# Patient Record
Sex: Male | Born: 1950 | Race: Black or African American | Hispanic: No | Marital: Single | Smoking: Never smoker
Health system: Southern US, Community
[De-identification: ages and names within clinical notes are randomized; demographics above are authoritative.]

## PROBLEM LIST (undated history)

## (undated) DIAGNOSIS — I509 Heart failure, unspecified: Secondary | ICD-10-CM

## (undated) DIAGNOSIS — I4891 Unspecified atrial fibrillation: Secondary | ICD-10-CM

## (undated) DIAGNOSIS — E669 Obesity, unspecified: Secondary | ICD-10-CM

## (undated) DIAGNOSIS — I10 Essential (primary) hypertension: Secondary | ICD-10-CM

## (undated) HISTORY — DX: Essential (primary) hypertension: I10

---

## 2010-09-05 ENCOUNTER — Emergency Department (HOSPITAL_COMMUNITY)

## 2010-09-05 ENCOUNTER — Emergency Department (HOSPITAL_COMMUNITY)
Admission: EM | Admit: 2010-09-05 | Discharge: 2010-09-05 | Disposition: A | Discharge status: Home / Self Care | Attending: Emergency Medicine | Admitting: Emergency Medicine

## 2010-09-05 DIAGNOSIS — R0602 Shortness of breath: Secondary | ICD-10-CM | POA: Insufficient documentation

## 2010-09-05 DIAGNOSIS — I10 Essential (primary) hypertension: Secondary | ICD-10-CM | POA: Insufficient documentation

## 2010-09-05 DIAGNOSIS — I509 Heart failure, unspecified: Secondary | ICD-10-CM | POA: Insufficient documentation

## 2010-09-05 DIAGNOSIS — R609 Edema, unspecified: Secondary | ICD-10-CM | POA: Insufficient documentation

## 2010-09-05 LAB — POCT I-STAT, CHEM 8
Calcium, Ion: 1.15 mmol/L (ref 1.12–1.32)
Glucose, Bld: 95 mg/dL (ref 70–99)
HCT: 36 % — ABNORMAL LOW (ref 39.0–52.0)
Hemoglobin: 12.2 g/dL — ABNORMAL LOW (ref 13.0–17.0)
Potassium: 4 mEq/L (ref 3.5–5.1)

## 2010-09-05 LAB — DIFFERENTIAL
Basophils Absolute: 0.1 10*3/uL (ref 0.0–0.1)
Basophils Relative: 1 % (ref 0–1)
Eosinophils Absolute: 0.5 10*3/uL (ref 0.0–0.7)
Eosinophils Relative: 6 % — ABNORMAL HIGH (ref 0–5)
Lymphocytes Relative: 18 % (ref 12–46)

## 2010-09-05 LAB — CBC
Platelets: 254 10*3/uL (ref 150–400)
RDW: 15.6 % — ABNORMAL HIGH (ref 11.5–15.5)
WBC: 7.8 10*3/uL (ref 4.0–10.5)

## 2011-09-22 ENCOUNTER — Ambulatory Visit (INDEPENDENT_AMBULATORY_CARE_PROVIDER_SITE_OTHER): Admitting: Family Medicine

## 2011-09-22 VITALS — BP 165/99 | HR 76 | Temp 98.3°F | Resp 18 | Ht 71.0 in | Wt 270.8 lb

## 2011-09-22 DIAGNOSIS — I10 Essential (primary) hypertension: Secondary | ICD-10-CM

## 2011-09-22 DIAGNOSIS — R21 Rash and other nonspecific skin eruption: Secondary | ICD-10-CM

## 2011-09-22 MED ORDER — LABETALOL HCL 100 MG PO TABS: ORAL_TABLET | ORAL | Status: DC

## 2011-09-22 MED ORDER — AMLODIPINE BESYLATE 10 MG PO TABS: 10.0000 mg | ORAL_TABLET | Freq: Every day | ORAL | Status: DC

## 2011-09-22 NOTE — Progress Notes (Signed)
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 He's had high blood pressure for 20 years. In the past she's taken labetalol 200 mg twice a day and functioned very nicely. About 5 months ago he was seen by another doctor who put him on 300 mg of labetalol 3 times a day and has been sleeping and unable to work ever since. In addition he complains about a rash on his left forearm which he said began after he started increasing his nifedipine  Objective: Obese African American man in no acute distress. Blood pressure week check 150/88  HEENT: Unremarkable  Chest: Clear  Heart: Regular, 1/6 systolic ejection murmur  Abdomen: Soft nontender without HSM  Extremities: No edema  Skin: Confluent macular, annular rash in left antecubital area with mild hyperpigmentation  Assessment: Overmedicated with respect to high blood pressure, macular rash secondary to nifedipine

## 2012-06-11 ENCOUNTER — Other Ambulatory Visit: Payer: Self-pay | Admitting: Family Medicine

## 2012-09-08 ENCOUNTER — Ambulatory Visit (INDEPENDENT_AMBULATORY_CARE_PROVIDER_SITE_OTHER): Admitting: Family Medicine

## 2012-09-08 VITALS — BP 155/95 | HR 90 | Temp 97.7°F | Resp 16 | Ht 70.5 in | Wt 271.0 lb

## 2012-09-08 DIAGNOSIS — I10 Essential (primary) hypertension: Secondary | ICD-10-CM | POA: Insufficient documentation

## 2012-09-08 LAB — POCT CBC
HCT, POC: 40.2 % — AB (ref 43.5–53.7)
Lymph, poc: 2 (ref 0.6–3.4)
MCHC: 31.3 g/dL — AB (ref 31.8–35.4)
MCV: 89.3 fL (ref 80–97)
POC LYMPH PERCENT: 19.9 %L (ref 10–50)
RDW, POC: 16.7 %
WBC: 9.8 10*3/uL (ref 4.6–10.2)

## 2012-09-08 LAB — COMPREHENSIVE METABOLIC PANEL
Albumin: 4.1 g/dL (ref 3.5–5.2)
Alkaline Phosphatase: 53 U/L (ref 39–117)
BUN: 15 mg/dL (ref 6–23)
CO2: 27 mEq/L (ref 19–32)
Glucose, Bld: 104 mg/dL — ABNORMAL HIGH (ref 70–99)
Potassium: 4.2 mEq/L (ref 3.5–5.3)
Total Bilirubin: 0.8 mg/dL (ref 0.3–1.2)

## 2012-09-08 MED ORDER — LABETALOL HCL 100 MG PO TABS: 100.0000 mg | ORAL_TABLET | Freq: Three times a day (TID) | ORAL | Status: DC

## 2012-09-08 MED ORDER — AMLODIPINE BESYLATE 10 MG PO TABS: 10.0000 mg | ORAL_TABLET | Freq: Every day | ORAL | Status: DC

## 2012-09-08 NOTE — Patient Instructions (Addendum)
Please continue to take your medications and check your blood pressure every couple of days.  Keep a record of your BP readings.  Please contact me with your BP readings in a couple of weeks.

## 2012-09-08 NOTE — Progress Notes (Addendum)
[facility] [address], [place] [address] [phone]  Date:  09/08/2012   Name:  Tanner Gilbert   DOB:  [birth year]   MRN:  [id number]  PCP:  Provider Not In System    Chief Complaint: Medication Refill   History of Present Illness:  Tanner Gilbert is a 62 y.o. very pleasant male patient who presents with the following:  He is here today to get a refill of his BP medications.  He takes labetolol 100 TID and norvasc 10 once a day. He took a labetolol just now.  Last year Dr. Milus Glazier decreased his labetolol dosage and stopped his nifedipine due to sedation and rash (thought due to nifedipine).   He does check his BP sometimes- but is not sure what sort of reading he might get.  He does not record this.   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 However, he does not want to D/C this medication.  "Every time I take my labetolol I need to take a nap."  He does not wish to D/C this medication, but might like to decrease the dose   There are no active problems to display for this patient.   No past medical history on file.  No past surgical history on file.  History  Substance Use Topics  . Smoking status: Never Smoker   . Smokeless tobacco: Not on file  . Alcohol Use: No    No family history on file.  No Known Allergies  Medication list has been reviewed and updated.  Current Outpatient Prescriptions on File Prior to Visit  Medication Sig Dispense Refill  . amLODipine (NORVASC) 10 MG tablet Take 1 tablet (10 mg total) by mouth daily.  90 tablet  3  . aspirin 81 MG tablet Take 81 mg by mouth daily.      Marland Kitchen labetalol (NORMODYNE) 100 MG tablet Take 1 tablet (100 mg total) by mouth 3 (three) times daily. Needs office visit  270 tablet  0   No current facility-administered medications on file prior to visit.    Review of  Systems:  As per HPI- otherwise negative.   Physical Examination: Filed Vitals:   09/08/12 1253  BP: 167/107  Pulse: 102  Temp: 97.7 F (36.5 C)  Resp: 16   Filed Vitals:   09/08/12 1253  Height: 5' 10.5" (1.791 m)  Weight: 271 lb (122.925 kg)   Body mass index is 38.32 kg/(m^2). Ideal Body Weight: Weight in (lb) to have BMI = 25: 176.4  GEN: WDWN, NAD, Non-toxic, A & O x 3, obese HEENT: Atraumatic, Normocephalic. Neck supple. No masses, No LAD. Ears and Nose: No external deformity. CV: RRR, No M/G/R. No JVD. No thrill. No extra heart sounds. PULM: CTA B, no wheezes, crackles, rhonchi. No retractions. No resp. distress. No accessory muscle use. EXTR: No c/c/e NEURO Normal gait.  PSYCH: Normally interactive. Conversant. Not depressed or anxious appearing.  Calm demeanor.    Assessment and Plan: Hypertension - Plan: amLODipine (NORVASC) 10 MG tablet, labetalol (NORMODYNE) 100 MG tablet, POCT CBC, Comprehensive metabolic panel  BP medication refill. He would like to decrease his labetolol dosage due to SE, but his BP is not well controlled per today's reading.  We do not have much data regarding his BP- asked him to please check his BP with his home  cuff every couple of days and to get back in touch with me with these results in about 2 weeks.  Await CMP- will follow- up with him.  Also plan to schedule a sleep study.    Signed Abbe Amsterdam, MD  Results for orders placed in visit on 09/08/12  COMPREHENSIVE METABOLIC PANEL      Result Value Range   Sodium 136  135 - 145 mEq/L   Potassium 4.2  3.5 - 5.3 mEq/L   Chloride 101  96 - 112 mEq/L   CO2 27  19 - 32 mEq/L   Glucose, Bld 104 (*) 70 - 99 mg/dL   BUN 15  6 - 23 mg/dL   Creat 1.61  0.96 - 0.45 mg/dL   Total Bilirubin 0.8  0.3 - 1.2 mg/dL   Alkaline Phosphatase 53  39 - 117 U/L   AST 22  0 - 37 U/L   ALT 26  0 - 53 U/L   Total Protein 8.1  6.0 - 8.3 g/dL   Albumin 4.1  3.5 - 5.2 g/dL   Calcium 9.4  8.4 - 40.9  mg/dL  POCT CBC      Result Value Range   WBC 9.8  4.6 - 10.2 K/uL   Lymph, poc 2.0  0.6 - 3.4   POC LYMPH PERCENT 19.9  10 - 50 %L   MID (cbc) 0.6  0 - 0.9   POC MID % 6.5  0 - 12 %M   POC Granulocyte 7.2 (*) 2 - 6.9   Granulocyte percent 73.6  37 - 80 %G   RBC 4.50 (*) 4.69 - 6.13 M/uL   Hemoglobin 12.6 (*) 14.1 - 18.1 g/dL   HCT, POC 81.1 (*) 91.4 - 53.7 %   MCV 89.3  80 - 97 fL   MCH, POC 28.0  27 - 31.2 pg   MCHC 31.3 (*) 31.8 - 35.4 g/dL   RDW, POC 78.2     Platelet Count, POC 252  142 - 424 K/uL   MPV 8.9  0 - 99.8 fL   Called regarding his labs- however, his number did not work, and the other number listed (his son) is a Materials engineer.   He needs to be alerted regarding his anemia (needs a colonoscopy) and I would also like to arrange a sleep study.   Will send him a letter

## 2012-09-09 ENCOUNTER — Encounter: Payer: Self-pay | Admitting: Family Medicine

## 2012-09-11 ENCOUNTER — Other Ambulatory Visit: Payer: Self-pay | Admitting: Family Medicine

## 2012-10-06 ENCOUNTER — Other Ambulatory Visit: Payer: Self-pay | Admitting: Family Medicine

## 2012-10-06 NOTE — Progress Notes (Signed)
Pt sent me a copy of several BP readings. Some examples 111/72, 129/ 81, 132/80, 138/83.   Will send a letter asking him to come in and be seen

## 2013-08-25 ENCOUNTER — Other Ambulatory Visit: Payer: Self-pay | Admitting: Family Medicine

## 2013-09-09 ENCOUNTER — Ambulatory Visit (INDEPENDENT_AMBULATORY_CARE_PROVIDER_SITE_OTHER): Admitting: Family Medicine

## 2013-09-09 VITALS — BP 126/82 | HR 83 | Temp 98.4°F | Resp 18 | Ht 71.0 in | Wt 263.0 lb

## 2013-09-09 DIAGNOSIS — I10 Essential (primary) hypertension: Secondary | ICD-10-CM

## 2013-09-09 DIAGNOSIS — Z13220 Encounter for screening for lipoid disorders: Secondary | ICD-10-CM

## 2013-09-09 DIAGNOSIS — Z125 Encounter for screening for malignant neoplasm of prostate: Secondary | ICD-10-CM

## 2013-09-09 LAB — CBC
HEMATOCRIT: 36.5 % — AB (ref 39.0–52.0)
Hemoglobin: 13.1 g/dL (ref 13.0–17.0)
MCH: 29 pg (ref 26.0–34.0)
MCHC: 35.9 g/dL (ref 30.0–36.0)
MCV: 80.8 fL (ref 78.0–100.0)
PLATELETS: 252 10*3/uL (ref 150–400)
RBC: 4.52 MIL/uL (ref 4.22–5.81)
RDW: 15.2 % (ref 11.5–15.5)
WBC: 8.8 10*3/uL (ref 4.0–10.5)

## 2013-09-09 MED ORDER — AMLODIPINE BESYLATE 5 MG PO TABS: 5.0000 mg | ORAL_TABLET | Freq: Every day | ORAL | Status: DC

## 2013-09-09 MED ORDER — LABETALOL HCL 100 MG PO TABS: 100.0000 mg | ORAL_TABLET | Freq: Three times a day (TID) | ORAL | Status: DC

## 2013-09-09 NOTE — Patient Instructions (Signed)
Good to see you today- your BP looks fine. I will be in touch with your labs.    If you like, get the shingles vaccine using your paper rx.  This is available at most major drug stores and is a one- time shot.    Decrease your amlodipine to 5mg  a day. However if you continue to run low (less than about 120/70) we can probably stop this medication entirely.  Just let me know if you continue to run low

## 2013-09-09 NOTE — Progress Notes (Signed)
[facility] [address], [place] [address] [phone]  Date:  09/09/2013   Name:  Tanner Gilbert   DOB:  01-27-1951   MRN:  [id number]  PCP:  PROVIDER NOT IN SYSTEM    Chief Complaint: Follow-up   History of Present Illness:  Tanner Gilbert is a 63 y.o. very pleasant male patient who presents with the following:  Here today to follow-up HTN.  He does check his BP at home and may get 120/76, max about 127/80.  He is feeling well.   He did eat breakfast and took his meds this am.  He does notice that after he takes his am amlodipine and labetolol he will sometimes feel like his BP gets low and he has to lie down  He did have a colon[place], [place].  His last was about 8 years ago Also had a tetanus 4 or 5 years ago.    Patient Active Problem List   Diagnosis Date Noted  . HTN (hypertension) with goal to be determined 09/08/2012  . Morbid obesity 09/08/2012    Past Medical History  Diagnosis Date  . Hypertension     History reviewed. No pertinent past surgical history.  History  Substance Use Topics  . Smoking status: Never Smoker   . Smokeless tobacco: Not on file  . Alcohol Use: No    History reviewed. No pertinent family history.  No Known Allergies  Medication list has been reviewed and updated.  Current Outpatient Prescriptions on File Prior to Visit  Medication Sig Dispense Refill  . amLODipine (NORVASC) 10 MG tablet Take 1 tablet (10 mg total) by mouth daily. PATIENT NEEDS OFFICE VISIT FOR ADDITIONAL REFILLS  30 tablet  0  . aspirin 81 MG tablet Take 81 mg by mouth daily.      Marland Kitchen. labetalol (NORMODYNE) 100 MG tablet Take 1 tablet (100 mg total) by mouth 3 (three) times daily.  270 tablet  2   No current facility-administered medications on file prior to visit.    Review of Systems:  As per HPI- otherwise negative.   Physical Examination: Filed Vitals:   09/09/13 1124  BP: 126/82  Pulse: 83   Temp: 98.4 F (36.9 C)  Resp: 18   Filed Vitals:   09/09/13 1124  Height: 5\' 11"  (1.803 m)  Weight: 263 lb (119.296 kg)   Body mass index is 36.7 kg/(m^2). Ideal Body Weight: Weight in (lb) to have BMI = 25: 178.9  GEN: WDWN, NAD, Non-toxic, A & O x 3, obese , looks well HEENT: Atraumatic, Normocephalic. Neck supple. No masses, No LAD. Ears and Nose: No external deformity. CV: RRR, No M/G/R. No JVD. No thrill. No extra heart sounds. PULM: CTA B, no wheezes, crackles, rhonchi. No retractions. No resp. distress. No accessory muscle use. ABD: S, NT, ND. No rebound. No HSM. EXTR: No c/c/e NEURO Normal gait.  PSYCH: Normally interactive. Conversant. Not depressed or anxious appearing.  Calm demeanor.    Assessment and Plan: Hypertension - Plan: amLODipine (NORVASC) 5 MG tablet, labetalol (NORMODYNE) 100 MG tablet, CBC, Comprehensive metabolic panel  Screening for hyperlipidemia - Plan: Lipid panel  Screening for prostate cancer - Plan: PSA  BP is controlled.  He reports some sx of hypotension- will have him decrease his am amlodipine to 5mg  and he will continue to follow Given rx for zostavax  Will plan further follow- up pending labs.   Signed Abbe AmsterdamJessica Copland, MD

## 2013-09-10 LAB — COMPREHENSIVE METABOLIC PANEL
ALK PHOS: 48 U/L (ref 39–117)
ALT: 13 U/L (ref 0–53)
AST: 20 U/L (ref 0–37)
Albumin: 4.4 g/dL (ref 3.5–5.2)
BUN: 17 mg/dL (ref 6–23)
CO2: 26 mEq/L (ref 19–32)
CREATININE: 1.31 mg/dL (ref 0.50–1.35)
Calcium: 9.4 mg/dL (ref 8.4–10.5)
Chloride: 103 mEq/L (ref 96–112)
Glucose, Bld: 91 mg/dL (ref 70–99)
POTASSIUM: 4 meq/L (ref 3.5–5.3)
Sodium: 138 mEq/L (ref 135–145)
Total Bilirubin: 0.7 mg/dL (ref 0.2–1.2)
Total Protein: 8.1 g/dL (ref 6.0–8.3)

## 2013-09-10 LAB — LIPID PANEL
CHOL/HDL RATIO: 3.8 ratio
Cholesterol: 199 mg/dL (ref 0–200)
HDL: 52 mg/dL (ref >39)
LDL CALC: 123 mg/dL — AB (ref 0–99)
Triglycerides: 118 mg/dL (ref <150)
VLDL: 24 mg/dL (ref 0–40)

## 2013-09-10 LAB — PSA: PSA: 7.02 ng/mL — AB (ref <=4.00)

## 2013-09-13 ENCOUNTER — Encounter: Payer: Self-pay | Admitting: Family Medicine

## 2013-09-15 ENCOUNTER — Telehealth: Payer: Self-pay | Admitting: Family Medicine

## 2013-09-15 NOTE — Telephone Encounter (Signed)
Have been calling for several days in an attempt to reach him.  Listed number has no answer and no machine. Called emergency contact but this seems to be a wrong answer

## 2014-02-07 ENCOUNTER — Other Ambulatory Visit: Payer: Self-pay

## 2014-02-07 MED ORDER — LABETALOL HCL 100 MG PO TABS: 100.0000 mg | ORAL_TABLET | Freq: Three times a day (TID) | ORAL | Status: DC

## 2014-02-07 MED ORDER — AMLODIPINE BESYLATE 5 MG PO TABS: 5.0000 mg | ORAL_TABLET | Freq: Every day | ORAL | Status: DC

## 2014-08-21 ENCOUNTER — Ambulatory Visit (INDEPENDENT_AMBULATORY_CARE_PROVIDER_SITE_OTHER): Admitting: Family Medicine

## 2014-08-21 VITALS — BP 148/90 | HR 109 | Temp 98.2°F | Resp 18 | Ht 71.0 in | Wt 294.0 lb

## 2014-08-21 DIAGNOSIS — I10 Essential (primary) hypertension: Secondary | ICD-10-CM

## 2014-08-21 DIAGNOSIS — I4891 Unspecified atrial fibrillation: Secondary | ICD-10-CM | POA: Diagnosis not present

## 2014-08-21 DIAGNOSIS — Z5181 Encounter for therapeutic drug level monitoring: Secondary | ICD-10-CM | POA: Diagnosis not present

## 2014-08-21 DIAGNOSIS — E669 Obesity, unspecified: Secondary | ICD-10-CM

## 2014-08-21 DIAGNOSIS — Z23 Encounter for immunization: Secondary | ICD-10-CM

## 2014-08-21 DIAGNOSIS — I499 Cardiac arrhythmia, unspecified: Secondary | ICD-10-CM | POA: Diagnosis not present

## 2014-08-21 LAB — LIPID PANEL
Cholesterol: 184 mg/dL (ref 0–200)
HDL: 52 mg/dL (ref >=40)
LDL Cholesterol: 113 mg/dL — ABNORMAL HIGH (ref 0–99)
TRIGLYCERIDES: 95 mg/dL (ref <150)
Total CHOL/HDL Ratio: 3.5 Ratio
VLDL: 19 mg/dL (ref 0–40)

## 2014-08-21 LAB — COMPREHENSIVE METABOLIC PANEL
ALBUMIN: 4.1 g/dL (ref 3.5–5.2)
ALT: 15 U/L (ref 0–53)
AST: 21 U/L (ref 0–37)
Alkaline Phosphatase: 42 U/L (ref 39–117)
BUN: 16 mg/dL (ref 6–23)
CALCIUM: 9.4 mg/dL (ref 8.4–10.5)
CHLORIDE: 102 meq/L (ref 96–112)
CO2: 26 meq/L (ref 19–32)
Creat: 1.29 mg/dL (ref 0.50–1.35)
GLUCOSE: 104 mg/dL — AB (ref 70–99)
Potassium: 4.2 mEq/L (ref 3.5–5.3)
SODIUM: 137 meq/L (ref 135–145)
TOTAL PROTEIN: 7.7 g/dL (ref 6.0–8.3)
Total Bilirubin: 1 mg/dL (ref 0.2–1.2)

## 2014-08-21 LAB — TSH: TSH: 2.561 u[IU]/mL (ref 0.350–4.500)

## 2014-08-21 LAB — HEMOGLOBIN A1C
HEMOGLOBIN A1C: 5.8 % — AB (ref <5.7)
Mean Plasma Glucose: 120 mg/dL — ABNORMAL HIGH (ref <117)

## 2014-08-21 MED ORDER — APIXABAN 5 MG PO TABS: 5.0000 mg | ORAL_TABLET | Freq: Two times a day (BID) | ORAL | Status: DC

## 2014-08-21 MED ORDER — LABETALOL HCL 100 MG PO TABS: 100.0000 mg | ORAL_TABLET | Freq: Three times a day (TID) | ORAL | Status: DC

## 2014-08-21 MED ORDER — AMLODIPINE BESYLATE 5 MG PO TABS: 5.0000 mg | ORAL_TABLET | Freq: Every day | ORAL | Status: DC

## 2014-08-21 NOTE — Progress Notes (Addendum)
[facility] [address], [place] [address] [phone]  Date:  08/21/2014   Name:  Tanner Gilbert   DOB:  Feb 23, 1951   MRN:  [id number]  PCP:  PROVIDER NOT IN SYSTEM    Chief Complaint: Medication Refill and Follow-up   History of Present Illness:  Tanner Gilbert is a 64 y.o. very pleasant male patient who presents with the following:  Here today to follow-up HTN.  Last seen by myself about one year ago.  He needs refills today.  He has not been checking his BP a lot recently and has gained some weight- "I got a little lazy."   He is fasting today for labs  He has stopped exercising and would like to start back  He denies any history of A fib or other cardiac problems, never had any cardiac procedure or surgery.  No CP, no SOB, no palpations.  He does not have a history of DM  Wt Readings from Last 3 Encounters:  08/21/14 294 lb (133.358 kg)  09/09/13 263 lb (119.296 kg)  09/08/12 271 lb (122.925 kg)     BP Readings from Last 3 Encounters:  08/21/14 148/90  09/09/13 126/82  09/08/12 155/95     Patient Active Problem List   Diagnosis Date Noted  . HTN (hypertension) with goal to be determined 09/08/2012  . Morbid obesity 09/08/2012    Past Medical History  Diagnosis Date  . Hypertension     History reviewed. No pertinent past surgical history.  History  Substance Use Topics  . Smoking status: Never Smoker   . Smokeless tobacco: Not on file  . Alcohol Use: No    History reviewed. No pertinent family history.  Allergies  Allergen Reactions  . Lisinopril     Swelling- angioedema    Medication list has been reviewed and updated.  Current Outpatient Prescriptions on File Prior to Visit  Medication Sig Dispense Refill  . amLODipine (NORVASC) 5 MG tablet Take 1 tablet (5 mg total) by mouth daily. 90 tablet 1  . aspirin 81 MG tablet Take 81 mg by mouth daily.    Marland Kitchen. labetalol (NORMODYNE) 100 MG tablet Take 1 tablet (100 mg total)  by mouth 3 (three) times daily. 270 tablet 2   No current facility-administered medications on file prior to visit.    Review of Systems:  As per HPI- otherwise negative.   Physical Examination: Filed Vitals:   08/21/14 1316  BP: 148/90  Pulse: 109  Temp: 98.2 F (36.8 C)  Resp: 18   Filed Vitals:   08/21/14 1316  Height: 5\' 11"  (1.803 m)  Weight: 294 lb (133.358 kg)   Body mass index is 41.02 kg/(m^2). Ideal Body Weight: Weight in (lb) to have BMI = 25: 178.9  GEN: WDWN, NAD, Non-toxic, A & O x 3, obese, looks well HEENT: Atraumatic, Normocephalic. Neck supple. No masses, No LAD. Ears and Nose: No external deformity. CV: No M/G/R. No JVD. No thrill. No extra heart sounds.  Noted irregular rhythm.  EKG shows a fib PULM: CTA B, no wheezes, crackles, rhonchi. No retractions. No resp. distress. No accessory muscle use. ABD: S, NT, ND, +BS. No rebound. No HSM. EXTR: No c/c/trace edema vs normal wide ankles.   NEURO Normal gait.  PSYCH: Normally interactive. Conversant. Not depressed or anxious appearing.  Calm demeanor.   EKG: atrial fibrillation- [facility] Dr. Eldridge DaceVaranasi Assessment and Plan: Atrial fibrillation, unspecified - Plan:  TSH, apixaban (ELIQUIS) 5 MG TABS tablet, Ambulatory referral to Cardiology, Brain natriuretic peptide  Essential hypertension - Plan: amLODipine (NORVASC) 5 MG tablet, labetalol (NORMODYNE) 100 MG tablet  Obesity - Plan: Lipid panel, Hemoglobin A1c  Medication monitoring encounter - Plan: Comprehensive metabolic panel  Irregular heart rhythm - Plan: EKG 12-Lead  Immunization due - Plan: Tdap vaccine greater than or equal to 7yo IM  Medication refills and labs today. tdap vaccine.  Also noted to be in a fib.  Discussed in detail with pt and his son, answered all questions . CHADS2 level of 2, will start on eliquis 5 BID.  Referral to cardiology soon.  Discussed risks and benefits of eliquis vs untreated a  fib (son had several questions which I answered to their satisfaction).   Await labs, do not suspect CHF but will check BNP for him with labs   Signed Abbe Amsterdam, MD  Called 5/24:  Results for orders placed or performed in visit on 08/21/14  Comprehensive metabolic panel  Result Value Ref Range   Sodium 137 135 - 145 mEq/L   Potassium 4.2 3.5 - 5.3 mEq/L   Chloride 102 96 - 112 mEq/L   CO2 26 19 - 32 mEq/L   Glucose, Bld 104 (H) 70 - 99 mg/dL   BUN 16 6 - 23 mg/dL   Creat 0.98 1.19 - 1.47 mg/dL   Total Bilirubin 1.0 0.2 - 1.2 mg/dL   Alkaline Phosphatase 42 39 - 117 U/L   AST 21 0 - 37 U/L   ALT 15 0 - 53 U/L   Total Protein 7.7 6.0 - 8.3 g/dL   Albumin 4.1 3.5 - 5.2 g/dL   Calcium 9.4 8.4 - 82.9 mg/dL  Lipid panel  Result Value Ref Range   Cholesterol 184 0 - 200 mg/dL   Triglycerides 95 <562 mg/dL   HDL 52 >=13 mg/dL   Total CHOL/HDL Ratio 3.5 Ratio   VLDL 19 0 - 40 mg/dL   LDL Cholesterol 086 (H) 0 - 99 mg/dL  Hemoglobin V7Q  Result Value Ref Range   Hgb A1c MFr Bld 5.8 (H) <5.7 %   Mean Plasma Glucose 120 (H) <117 mg/dL  TSH  Result Value Ref Range   TSH 2.561 0.350 - 4.500 uIU/mL  Brain natriuretic peptide  Result Value Ref Range   Brain Natriuretic Peptide 336.8 (H) 0.0 - 100.0 pg/mL   Called number listed for pt- got a message that the person at the number is not taking calls. Called his son's number- no answer, not able to LM

## 2014-08-21 NOTE — Patient Instructions (Addendum)
You have atrial fibrillation.  This means that your heart beat is not following the normal pattern.   The main danger with this condition is a blood clot- therefore, we are going to start you on a blood thinner called eliquis Take this twice a day and STOP the baby aspirin. Otherwise you do not need to do anything differently at this time Continue your other blood pressure medications I will refer you to a cardiologist soon to look at this further. They will probably try to get you back into normal heart rhythm  Atrial Fibrillation Atrial fibrillation is a type of irregular heart rhythm (arrhythmia). During atrial fibrillation, the upper chambers of the heart (atria) quiver continuously in a chaotic pattern. This causes an irregular and often rapid heart rate.  Atrial fibrillation is the result of the heart becoming overloaded with disorganized signals that tell it to beat. These signals are normally released one at a time by a part of the right atrium called the sinoatrial node. They then travel from the atria to the lower chambers of the heart (ventricles), causing the atria and ventricles to contract and pump blood as they pass. In atrial fibrillation, parts of the atria outside of the sinoatrial node also release these signals. This results in two problems. First, the atria receive so many signals that they do not have time to fully contract. Second, the ventricles, which can only receive one signal at a time, beat irregularly and out of rhythm with the atria.  There are three types of atrial fibrillation:   Paroxysmal. Paroxysmal atrial fibrillation starts suddenly and stops on its own within a week.  Persistent. Persistent atrial fibrillation lasts for more than a week. It may stop on its own or with treatment.  Permanent. Permanent atrial fibrillation does not go away. Episodes of atrial fibrillation may lead to permanent atrial fibrillation. Atrial fibrillation can prevent your heart from  pumping blood normally. It increases your risk of stroke and can lead to heart failure.  CAUSES   Heart conditions, including a heart attack, heart failure, coronary artery disease, and heart valve conditions.   Inflammation of the sac that surrounds the heart (pericarditis).  Blockage of an artery in the lungs (pulmonary embolism).  Pneumonia or other infections.  Chronic lung disease.  Thyroid problems, especially if the thyroid is overactive (hyperthyroidism).  Caffeine, excessive alcohol use, and use of some illegal drugs.   Use of some medicines, including certain decongestants and diet pills.  Heart surgery.   Birth defects.  Sometimes, no cause can be found. When this happens, the atrial fibrillation is called lone atrial fibrillation. The risk of complications from atrial fibrillation increases if you have lone atrial fibrillation and you are age 65 years or older. RISK FACTORS  Heart failure.  Coronary artery disease.  Diabetes mellitus.   High blood pressure (hypertension).   Obesity.   Other arrhythmias.   Increased age. SIGNS AND SYMPTOMS   A feeling that your heart is beating rapidly or irregularly.   A feeling of discomfort or pain in your chest.   Shortness of breath.   Sudden light-headedness or weakness.   Getting tired easily when exercising.   Urinating more often than normal (mainly when atrial fibrillation first begins).  In paroxysmal atrial fibrillation, symptoms may start and suddenly stop. DIAGNOSIS  Your health care provider may be able to detect atrial fibrillation when taking your pulse. Your health care provider may have you take a test called an ambulatory electrocardiogram (  ECG). An ECG records your heartbeat patterns over a 24-hour period. You may also have other tests, such as:  Transthoracic echocardiogram (TTE). During echocardiography, sound waves are used to evaluate how blood flows through your  heart.  Transesophageal echocardiogram (TEE).  Stress test. There is more than one type of stress test. If a stress test is needed, ask your health care provider about which type is best for you.  Chest X-ray exam.  Blood tests.  Computed tomography (CT). TREATMENT  Treatment may include:  Treating any underlying conditions. For example, if you have an overactive thyroid, treating the condition may correct atrial fibrillation.  Taking medicine. Medicines may be given to control a rapid heart rate or to prevent blood clots, heart failure, or a stroke.  Having a procedure to correct the rhythm of the heart:  Electrical cardioversion. During electrical cardioversion, a controlled, low-energy shock is delivered to the heart through your skin. If you have chest pain, very low blood pressure, or sudden heart failure, this procedure may need to be done as an emergency.  Catheter ablation. During this procedure, heart tissues that send the signals that cause atrial fibrillation are destroyed.  Surgical ablation. During this surgery, thin lines of heart tissue that carry the abnormal signals are destroyed. This procedure can either be an open-heart surgery or a minimally invasive surgery. With the minimally invasive surgery, small cuts are made to access the heart instead of a large opening.  Pulmonary venous isolation. During this surgery, tissue around the veins that carry blood from the lungs (pulmonary veins) is destroyed. This tissue is thought to carry the abnormal signals. HOME CARE INSTRUCTIONS   Take medicines only as directed by your health care provider. Some medicines can make atrial fibrillation worse or recur.  If blood thinners were prescribed by your health care provider, take them exactly as directed. Too much blood-thinning medicine can cause bleeding. If you take too little, you will not have the needed protection against stroke and other problems.  Perform blood tests at  home if directed by your health care provider. Perform blood tests exactly as directed.  Quit smoking if you smoke.  Do not drink alcohol.  Do not drink caffeinated beverages such as coffee, soda, and some teas. You may drink decaffeinated coffee, soda, or tea.   Maintain a healthy weight.Do not use diet pills unless your health care provider approves. They may make heart problems worse.   Follow diet instructions as directed by your health care provider.  Exercise regularly as directed by your health care provider.  Keep all follow-up visits as directed by your health care provider. This is important. PREVENTION  The following substances can cause atrial fibrillation to recur:   Caffeinated beverages.  Alcohol.  Certain medicines, especially those used for breathing problems.  Certain herbs and herbal medicines, such as those containing ephedra or ginseng.  Illegal drugs, such as cocaine and amphetamines. Sometimes medicines are given to prevent atrial fibrillation from recurring. Proper treatment of any underlying condition is also important in helping prevent recurrence.  SEEK MEDICAL CARE IF:  You notice a change in the rate, rhythm, or strength of your heartbeat.  You suddenly begin urinating more frequently.  You tire more easily when exerting yourself or exercising. SEEK IMMEDIATE MEDICAL CARE IF:   You have chest pain, abdominal pain, sweating, or weakness.  You feel nauseous.  You have shortness of breath.  You suddenly have swollen feet and ankles.  You feel dizzy.  Your face or limbs feel numb or weak.  You have a change in your vision or speech. MAKE SURE YOU:   Understand these instructions.  Will watch your condition.  Will get help right away if you are not doing well or get worse. Document Released: 03/17/2005 Document Revised: 08/01/2013 Document Reviewed: 04/27/2012 [facility] Patient Information 2015 [place], [place]. This information is  not intended to replace advice given to you by your health care provider. Make sure you discuss any questions you have with your health care provider.

## 2014-08-22 LAB — BRAIN NATRIURETIC PEPTIDE: BRAIN NATRIURETIC PEPTIDE: 336.8 pg/mL — AB (ref 0.0–100.0)

## 2014-08-24 ENCOUNTER — Telehealth: Payer: Self-pay | Admitting: Family Medicine

## 2014-08-24 ENCOUNTER — Encounter: Payer: Self-pay | Admitting: Family Medicine

## 2014-08-24 NOTE — Telephone Encounter (Signed)
Called again- I still cannot reach pt or any family member, cannot LMOM.  Will send a letter at this time- certified

## 2014-08-24 NOTE — Telephone Encounter (Signed)
error 

## 2014-08-24 NOTE — Telephone Encounter (Signed)
° °  We attempted to call pt 3 times and number stated pt isn't accepting calls at this time. We will be deleting pt from WQ and if a further appointment is need, a new referral would need to be entered.  Thanks Erin Sonsebra Moore Patient Care Coordinator [facility]

## 2014-09-21 ENCOUNTER — Encounter: Payer: Self-pay | Admitting: Family Medicine

## 2015-05-21 ENCOUNTER — Other Ambulatory Visit: Payer: Self-pay

## 2015-05-21 DIAGNOSIS — I10 Essential (primary) hypertension: Secondary | ICD-10-CM

## 2015-05-21 MED ORDER — AMLODIPINE BESYLATE 5 MG PO TABS: 5.0000 mg | ORAL_TABLET | Freq: Every day | ORAL | Status: DC

## 2015-09-08 ENCOUNTER — Emergency Department (HOSPITAL_COMMUNITY): Payer: Medicare Other

## 2015-09-08 ENCOUNTER — Encounter (HOSPITAL_COMMUNITY): Payer: Self-pay | Admitting: Emergency Medicine

## 2015-09-08 ENCOUNTER — Inpatient Hospital Stay (HOSPITAL_COMMUNITY)
Admission: EM | Admit: 2015-09-08 | Discharge: 2015-09-11 | DRG: 292 | Disposition: A | Discharge status: Home / Self Care | Payer: Medicare Other | Attending: Internal Medicine | Admitting: Internal Medicine

## 2015-09-08 DIAGNOSIS — I482 Chronic atrial fibrillation: Secondary | ICD-10-CM | POA: Diagnosis not present

## 2015-09-08 DIAGNOSIS — I110 Hypertensive heart disease with heart failure: Principal | ICD-10-CM | POA: Diagnosis present

## 2015-09-08 DIAGNOSIS — I509 Heart failure, unspecified: Secondary | ICD-10-CM | POA: Diagnosis not present

## 2015-09-08 DIAGNOSIS — Z7982 Long term (current) use of aspirin: Secondary | ICD-10-CM

## 2015-09-08 DIAGNOSIS — E876 Hypokalemia: Secondary | ICD-10-CM | POA: Diagnosis not present

## 2015-09-08 DIAGNOSIS — I429 Cardiomyopathy, unspecified: Secondary | ICD-10-CM | POA: Diagnosis not present

## 2015-09-08 DIAGNOSIS — Z888 Allergy status to other drugs, medicaments and biological substances status: Secondary | ICD-10-CM

## 2015-09-08 DIAGNOSIS — R0602 Shortness of breath: Secondary | ICD-10-CM | POA: Diagnosis not present

## 2015-09-08 DIAGNOSIS — I481 Persistent atrial fibrillation: Secondary | ICD-10-CM | POA: Diagnosis present

## 2015-09-08 DIAGNOSIS — Z79899 Other long term (current) drug therapy: Secondary | ICD-10-CM | POA: Diagnosis not present

## 2015-09-08 DIAGNOSIS — Z8249 Family history of ischemic heart disease and other diseases of the circulatory system: Secondary | ICD-10-CM | POA: Diagnosis not present

## 2015-09-08 DIAGNOSIS — R748 Abnormal levels of other serum enzymes: Secondary | ICD-10-CM | POA: Diagnosis present

## 2015-09-08 DIAGNOSIS — I5020 Unspecified systolic (congestive) heart failure: Secondary | ICD-10-CM | POA: Diagnosis not present

## 2015-09-08 DIAGNOSIS — Z6841 Body Mass Index (BMI) 40.0 and over, adult: Secondary | ICD-10-CM | POA: Diagnosis not present

## 2015-09-08 DIAGNOSIS — I5043 Acute on chronic combined systolic (congestive) and diastolic (congestive) heart failure: Secondary | ICD-10-CM | POA: Diagnosis present

## 2015-09-08 DIAGNOSIS — I5023 Acute on chronic systolic (congestive) heart failure: Secondary | ICD-10-CM | POA: Insufficient documentation

## 2015-09-08 DIAGNOSIS — I10 Essential (primary) hypertension: Secondary | ICD-10-CM | POA: Diagnosis not present

## 2015-09-08 DIAGNOSIS — E871 Hypo-osmolality and hyponatremia: Secondary | ICD-10-CM | POA: Diagnosis present

## 2015-09-08 DIAGNOSIS — E8770 Fluid overload, unspecified: Secondary | ICD-10-CM | POA: Diagnosis not present

## 2015-09-08 HISTORY — DX: Unspecified atrial fibrillation: I48.91

## 2015-09-08 HISTORY — DX: Obesity, unspecified: E66.9

## 2015-09-08 HISTORY — DX: Heart failure, unspecified: I50.9

## 2015-09-08 LAB — CBC WITH DIFFERENTIAL/PLATELET
BASOS ABS: 0.1 10*3/uL (ref 0.0–0.1)
BASOS PCT: 1 %
EOS ABS: 0.3 10*3/uL (ref 0.0–0.7)
EOS PCT: 2 %
HCT: 38 % — ABNORMAL LOW (ref 39.0–52.0)
Hemoglobin: 12.9 g/dL — ABNORMAL LOW (ref 13.0–17.0)
Lymphocytes Relative: 15 %
Lymphs Abs: 1.8 10*3/uL (ref 0.7–4.0)
MCH: 30.9 pg (ref 26.0–34.0)
MCHC: 33.9 g/dL (ref 30.0–36.0)
MCV: 90.9 fL (ref 78.0–100.0)
MONO ABS: 0.8 10*3/uL (ref 0.1–1.0)
MONOS PCT: 6 %
Neutro Abs: 9.4 10*3/uL — ABNORMAL HIGH (ref 1.7–7.7)
Neutrophils Relative %: 76 %
PLATELETS: 236 10*3/uL (ref 150–400)
RBC: 4.18 MIL/uL — ABNORMAL LOW (ref 4.22–5.81)
RDW: 14.2 % (ref 11.5–15.5)
WBC: 12.4 10*3/uL — ABNORMAL HIGH (ref 4.0–10.5)

## 2015-09-08 LAB — BASIC METABOLIC PANEL
Anion gap: 8 (ref 5–15)
BUN: 11 mg/dL (ref 6–20)
CO2: 24 mmol/L (ref 22–32)
CREATININE: 1.25 mg/dL — AB (ref 0.61–1.24)
Calcium: 9 mg/dL (ref 8.9–10.3)
Chloride: 99 mmol/L — ABNORMAL LOW (ref 101–111)
GFR, EST NON AFRICAN AMERICAN: 59 mL/min — AB (ref >60)
Glucose, Bld: 123 mg/dL — ABNORMAL HIGH (ref 65–99)
Potassium: 3.9 mmol/L (ref 3.5–5.1)
SODIUM: 131 mmol/L — AB (ref 135–145)

## 2015-09-08 LAB — URINALYSIS, ROUTINE W REFLEX MICROSCOPIC
Glucose, UA: NEGATIVE mg/dL
KETONES UR: NEGATIVE mg/dL
Leukocytes, UA: NEGATIVE
NITRITE: NEGATIVE
PH: 7 (ref 5.0–8.0)
PROTEIN: 100 mg/dL — AB
Specific Gravity, Urine: 1.016 (ref 1.005–1.030)

## 2015-09-08 LAB — URINE MICROSCOPIC-ADD ON: WBC UA: NONE SEEN WBC/hpf (ref 0–5)

## 2015-09-08 LAB — TROPONIN I
TROPONIN I: 0.04 ng/mL — AB (ref <0.031)
Troponin I: 0.04 ng/mL — ABNORMAL HIGH (ref <0.031)
Troponin I: 0.05 ng/mL — ABNORMAL HIGH (ref <0.031)

## 2015-09-08 LAB — TSH: TSH: 4.823 u[IU]/mL — ABNORMAL HIGH (ref 0.350–4.500)

## 2015-09-08 LAB — BRAIN NATRIURETIC PEPTIDE: B NATRIURETIC PEPTIDE 5: 733.8 pg/mL — AB (ref 0.0–100.0)

## 2015-09-08 MED ORDER — CARVEDILOL 6.25 MG PO TABS
6.2500 mg | ORAL_TABLET | Freq: Two times a day (BID) | ORAL | Status: DC
  Administered 2015-09-08 – 2015-09-09 (×2): 6.25 mg via ORAL
  Filled 2015-09-08 (×2): qty 1

## 2015-09-08 MED ORDER — ASPIRIN EC 81 MG PO TBEC
81.0000 mg | DELAYED_RELEASE_TABLET | Freq: Every day | ORAL | Status: DC
  Administered 2015-09-09 – 2015-09-10 (×2): 81 mg via ORAL
  Filled 2015-09-08 (×2): qty 1

## 2015-09-08 MED ORDER — FUROSEMIDE 10 MG/ML IJ SOLN
60.0000 mg | Freq: Two times a day (BID) | INTRAMUSCULAR | Status: DC
  Administered 2015-09-08 – 2015-09-09 (×2): 60 mg via INTRAVENOUS
  Filled 2015-09-08 (×2): qty 6

## 2015-09-08 MED ORDER — ALBUTEROL SULFATE (2.5 MG/3ML) 0.083% IN NEBU
5.0000 mg | INHALATION_SOLUTION | Freq: Once | RESPIRATORY_TRACT | Status: DC
  Filled 2015-09-08: qty 6

## 2015-09-08 MED ORDER — NITROGLYCERIN 2 % TD OINT
1.0000 [in_us] | TOPICAL_OINTMENT | Freq: Once | TRANSDERMAL | Status: AC
  Administered 2015-09-08: 1 [in_us] via TOPICAL
  Filled 2015-09-08: qty 1

## 2015-09-08 MED ORDER — ENOXAPARIN SODIUM 150 MG/ML ~~LOC~~ SOLN
1.0000 mg/kg | Freq: Two times a day (BID) | SUBCUTANEOUS | Status: DC
  Administered 2015-09-08 – 2015-09-09 (×2): 140 mg via SUBCUTANEOUS
  Filled 2015-09-08 (×4): qty 0.92

## 2015-09-08 MED ORDER — ISOSORB DINITRATE-HYDRALAZINE 20-37.5 MG PO TABS
1.0000 | ORAL_TABLET | Freq: Three times a day (TID) | ORAL | Status: DC
  Administered 2015-09-08 – 2015-09-11 (×10): 1 via ORAL
  Filled 2015-09-08 (×10): qty 1

## 2015-09-08 MED ORDER — FUROSEMIDE 10 MG/ML IJ SOLN
40.0000 mg | Freq: Once | INTRAMUSCULAR | Status: AC
  Administered 2015-09-08: 40 mg via INTRAVENOUS
  Filled 2015-09-08: qty 4

## 2015-09-08 MED ORDER — AMLODIPINE BESYLATE 5 MG PO TABS
5.0000 mg | ORAL_TABLET | Freq: Every day | ORAL | Status: DC
  Filled 2015-09-08: qty 1

## 2015-09-08 NOTE — H&P (Signed)
History and Physical  Tanner Gilbert NWG:[id number] DOB: [birth year] DOA: 09/08/2015  Referring physician: ER physician PCP: Abbe Amsterdam, MD  Outpatient Specialists:    Patient coming from: Home  Chief Complaint: SOB and DOE  HPI: 65 year old male with history of CHF (EF not readily known), HTN, Obesity and atrial fibrillation for which the patient tried taking eliquis but stopped due to non tolerance. Currently, patient is not on any anticoagulation. Patient presents with 2 day history of SOB, dyspnea on exertion and worsening leg edema. CXR revealed pulmonary edema. On presentation to the hospital, the SBP was significantly elevated (240/16mmhg), but the BP and the symptoms have improved with diuresis. History of angioedema to ACEI is documented. No headache, no neck pain, no chest pain, no fever or chills, no GI symptoms and no urinary symptoms.   ED Course: Diuresed, with improvement in symptoms and BP noted  Pertinent labs: CXR reveals pulmonary edema EKG: Independently reviewed.  Imaging: independently reviewed.   Review of Systems: As in HPI. 12 systems reviewed. Negative for fever, visual changes, sore throat, rash, new muscle aches, chest pain, dysuria, bleeding, n/v/abdominal pain.  Past Medical History  Diagnosis Date  . Hypertension   . CHF (congestive heart failure) (HCC)   . Atrial fibrillation (HCC)   . Obesity     History reviewed. No pertinent past surgical history.   reports that he has never smoked. He does not have any smokeless tobacco history on file. He reports that he does not drink alcohol or use illicit drugs.  Allergies  Allergen Reactions  . Apixaban Other (See Comments)    Stiffness of neck and head  . Lisinopril Swelling    Swelling- angioedema    No family history on file.   Prior to Admission medications   Medication Sig Start Date End Date Taking? Authorizing Provider  amLODipine (NORVASC) 5 MG tablet Take 1 tablet (5 mg total) by  mouth daily. 05/21/15  Yes Wallis Bamberg, PA-C  aspirin EC 81 MG tablet Take 81 mg by mouth daily.   Yes Historical Provider, MD  labetalol (NORMODYNE) 100 MG tablet Take 1 tablet (100 mg total) by mouth 3 (three) times daily. 08/21/14  Yes Gwenlyn Found Copland, MD  apixaban (ELIQUIS) 5 MG TABS tablet Take 1 tablet (5 mg total) by mouth 2 (two) times daily. Patient not taking: Reported on 09/08/2015 08/21/14   Pearline Cables, MD    Physical Exam: Filed Vitals:   09/08/15 1430 09/08/15 1459 09/08/15 1526 09/08/15 1529  BP: 144/98 161/100 161/115   Pulse: 96 104    Temp:  98.3 F (36.8 C)    TempSrc:  Oral Oral   Resp: Height:    6' (1.829 m)  Weight:    137.531 kg (303 lb 3.2 oz)  SpO2: 94% 95% 95%     Constitutional:  . Morbidly obese. Not in obvious distress.  Eyes:  . PERRL and irises appear normal ENMT:  . external ears, nose appear normal g Neck:  JVD is difficult to assess due to patient's body habitus. Neck is supple.  Respiratory:  . Decreased air entry globally. Marland Kitchen Respiratory effort normal. No retractions or accessory muscle use Cardiovascular:  . S1S2, irregularly irregular. Chronic leg edema Abdomen:  Abdomen is obese and non tender. Organs are difficult to assess.  Neurologic:  . Awake and alert. Moves all limbs.  Wt Readings from Last 3 Encounters:  09/08/15 137.531 kg (303 lb 3.2 oz)  08/21/14  133.358 kg (294 lb)  09/09/13 119.296 kg (263 lb)    I have personally reviewed following labs and imaging studies  Labs on Admission:  CBC:  Recent Labs Lab 09/08/15 0930  WBC 12.4*  NEUTROABS 9.4*  HGB 12.9*  HCT 38.0*  MCV 90.9  PLT 236   Basic Metabolic Panel:  Recent Labs Lab 09/08/15 0930  NA 131*  K 3.9  CL 99*  CO2 24  GLUCOSE 123*  BUN 11  CREATININE 1.25*  CALCIUM 9.0   Liver Function Tests: No results for input(s): AST, ALT, ALKPHOS, BILITOT, PROT, ALBUMIN in the last 168 hours. No results for input(s): LIPASE, AMYLASE in  the last 168 hours. No results for input(s): AMMONIA in the last 168 hours. Coagulation Profile: No results for input(s): INR, PROTIME in the last 168 hours. Cardiac Enzymes:  Recent Labs Lab 09/08/15 0930  TROPONINI 0.04*   BNP (last 3 results) No results for input(s): PROBNP in the last 8760 hours. HbA1C: No results for input(s): HGBA1C in the last 72 hours. CBG: No results for input(s): GLUCAP in the last 168 hours. Lipid Profile: No results for input(s): CHOL, HDL, LDLCALC, TRIG, CHOLHDL, LDLDIRECT in the last 72 hours. Thyroid Function Tests: No results for input(s): TSH, T4TOTAL, FREET4, T3FREE, THYROIDAB in the last 72 hours. Anemia Panel: No results for input(s): VITAMINB12, FOLATE, FERRITIN, TIBC, IRON, RETICCTPCT in the last 72 hours. Urine analysis:    Component Value Date/Time   COLORURINE AMBER* 09/08/2015 0955   APPEARANCEUR CLEAR 09/08/2015 0955   LABSPEC 1.016 09/08/2015 0955   PHURINE 7.0 09/08/2015 0955   GLUCOSEU NEGATIVE 09/08/2015 0955   HGBUR SMALL* 09/08/2015 0955   BILIRUBINUR SMALL* 09/08/2015 0955   KETONESUR NEGATIVE 09/08/2015 0955   PROTEINUR 100* 09/08/2015 0955   NITRITE NEGATIVE 09/08/2015 0955   LEUKOCYTESUR NEGATIVE 09/08/2015 0955   Sepsis Labs: @LABRCNTIP (procalcitonin:4,lacticidven:4) )No results found for this or any previous visit (from the past 240 hour(s)).    Radiological Exams on Admission: Dg Chest Port 1 View  09/08/2015  CLINICAL DATA:  Shortness of breath EXAM: PORTABLE CHEST 1 VIEW COMPARISON:  September 05, 2010 FINDINGS: Cardiomegaly and mild edema identified. Elevation of the right hemidiaphragm remains. No other acute abnormalities. IMPRESSION: Cardiomegaly and mild edema. Electronically Signed   By: Gerome Samavid  Williams III M.D   On: 09/08/2015 10:14    EKG: Independently reviewed.   Active Problems:   CHF (congestive heart failure) (HCC)   Assessment/Plan 1. CHF exacerbation, likely acute on chronic, type  unspecified 2. Accelerated hypertension 3. Atrial fibrillation, not currently on anticoagulation   Admit patient to telemetry floor  IV lasix 60mg  bid  Trial of bidil  Add coreg  S/C Lovenox   Cardiology consult  ECHO  Cautious control of Blood pressure  Further management will depend on hospital course.  DVT prophylaxis: Lovenox Code Status: Full Family Communication: Son Disposition Plan: Home eventually   Consults called: Cardiology   Admission status: inpatient    Time spent: 60 minutes  Berton MountSylvester Kaidon Kinker, MD  [facility] Pager #: [phone] 7PM-7AM contact night coverage as above   09/08/2015, 3:31 PM

## 2015-09-08 NOTE — ED Notes (Signed)
Pt reports SOB onset awakening yesterday; hx of CHF.

## 2015-09-08 NOTE — Consult Note (Signed)
CARDIOLOGY CONSULT NOTE       Patient ID: Tanner Gilbert MRN: [id number] DOB/AGE: 09-May-1950 65 y.o.  Admit date: 09/08/2015 Referring Physician:   Dartha Lodge Primary Physician: Abbe Amsterdam, MD Primary Cardiologist:  New Reason for Consultation: CHF and AFib  Active Problems:   CHF (congestive heart failure) (HCC)   HPI:  65 y.o. little medical history in [place]. Moved from [place] in 2011 lives with son. History of HTN.  Seen by Dr Patsy Lager 2016 with afib and was supposed to see Cardiology but never made appt. He then talks about seeing primary MD;s on [facility].  He has had LE edema for a month. He has had more dyspnea for the last 48 hrs.  In ER with significant volume overload, CHF and Afib.  He said in past he tried to take eliquis but had diffuse shoulder and neck stiffness and stopped after two doses. No chest pain Not clear that he has been taking any meds. Poor high salt diet. Son indicates he does all ADL and drives. Patient denies history of CHF but has seen heart specialist on two occasions In past at [facility]. Got iv lasix in ER with improvement.    ROS All other systems reviewed and negative except as noted above  Past Medical History  Diagnosis Date  . Hypertension   . CHF (congestive heart failure) (HCC)   . Atrial fibrillation (HCC)   . Obesity     Family History :  Both mother and father with HTN   Social History   Social History  . Marital Status: Single    Spouse Name: N/A  . Number of Children: N/A  . Years of Education: N/A   Occupational History  . Not on file.   Social History Main Topics  . Smoking status: Never Smoker   . Smokeless tobacco: Not on file  . Alcohol Use: No  . Drug Use: No  . Sexual Activity: Yes   Other Topics Concern  . Not on file   Social History Narrative    History reviewed. No pertinent past surgical history.   Melene Muller ON 09/09/2015] amLODipine  5 mg Oral Daily  . [START ON 09/09/2015]  aspirin EC  81 mg Oral Daily  . carvedilol  6.25 mg Oral BID WC  . enoxaparin (LOVENOX) injection  1 mg/kg Subcutaneous Q12H  . furosemide  60 mg Intravenous BID  . isosorbide-hydrALAZINE  1 tablet Oral TID      Physical Exam: BP 161/115 mmHg  Pulse 104  Temp(Src) 98.3 F (36.8 C) (Oral)  Resp 18  Ht 6' (1.829 m)  Wt 137.531 kg (303 lb 3.2 oz)  BMI 41.11 kg/m2  SpO2 95%   Affect appropriate Poorly kept obese black male  HEENT: normal Neck supple with no adenopathy JVP normal no bruits no thyromegaly Lungs basilar crackles  no wheezing and good diaphragmatic motion Heart:  S1/S2 no murmur, no rub, gallop or click PMI normal Abdomen: benighn, BS positve, no tenderness, no AAA no bruit.  No HSM or HJR Distal pulses intact with no bruits Plus 3 chronic LE  edema Neuro non-focal Skin warm and dry No muscular weakness    Labs:   Lab Results  Component Value Date   WBC 12.4* 09/08/2015   HGB 12.9* 09/08/2015   HCT 38.0* 09/08/2015   MCV 90.9 09/08/2015   PLT 236 09/08/2015     Recent Labs Lab 09/08/15 0930  NA 131*  K 3.9  CL 99*  CO2 24  BUN 11  CREATININE 1.25*  CALCIUM 9.0  GLUCOSE 123*   Lab Results  Component Value Date   TROPONINI 0.04* 09/08/2015    Lab Results  Component Value Date   CHOL 184 08/21/2014   CHOL 199 09/09/2013   Lab Results  Component Value Date   HDL 52 08/21/2014   HDL 52 09/09/2013   Lab Results  Component Value Date   LDLCALC 113* 08/21/2014   LDLCALC 123* 09/09/2013   Lab Results  Component Value Date   TRIG 95 08/21/2014   TRIG 118 09/09/2013   Lab Results  Component Value Date   CHOLHDL 3.5 08/21/2014   CHOLHDL 3.8 09/09/2013   No results found for: LDLDIRECT    Radiology: Dg Chest Port 1 View  09/08/2015  CLINICAL DATA:  Shortness of breath EXAM: PORTABLE CHEST 1 VIEW COMPARISON:  September 05, 2010 FINDINGS: Cardiomegaly and mild edema identified. Elevation of the right hemidiaphragm remains. No other  acute abnormalities. IMPRESSION: Cardiomegaly and mild edema. Electronically Signed   By: Gerome Samavid  Williams III M.D   On: 09/08/2015 10:14     EKG: afib rate 109 iCRBBB no acute ST changes   ASSESSMENT AND PLAN:  Afib:  Chronicity unknown. Rate control with coreg.  Since he has had reaction to eliquis would try Xarelto.  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  Stop norvasc ( he indicates it caused edema in past) and start low dose ACE iv bid lasix for now HTN:  Well controlled.  Stop calcium blocker start ACE  low sodium Dash type diet.    Further recs based on echo and response to iv diuresis    Signed: Charlton Hawseter Nishan 09/08/2015, 3:50 PM

## 2015-09-08 NOTE — ED Provider Notes (Signed)
CSN: [id number]     Arrival date & time 09/08/15  4098 History   First MD Initiated Contact with Patient 09/08/15 0919     Chief Complaint  Patient presents with  . Shortness of Breath      HPI Pt was seen at 0925. Per pt and his family, c/o gradual onset and worsening of persistent SOB for the past 2 days. SOB worsens on exertion and when laying down flat. States he has been sleeping upright sitting in a chair for the past 2 nights due to SOB. Endorses hx of CHF. Denies increasing pedal edema, no CP/palpitations, no cough, no abd pain, no N/V/D, no fevers.    Past Medical History  Diagnosis Date  . Hypertension   . CHF (congestive heart failure) (HCC)   . Atrial fibrillation (HCC)   . Obesity    History reviewed. No pertinent past surgical history.  Social History  Substance Use Topics  . Smoking status: Never Smoker   . Smokeless tobacco: None  . Alcohol Use: No    Review of Systems ROS: Statement: All systems negative except as marked or noted in the HPI; Constitutional: Negative for fever and chills. ; ; Eyes: Negative for eye pain, redness and discharge. ; ; ENMT: Negative for ear pain, hoarseness, nasal congestion, sinus pressure and sore throat. ; ; Cardiovascular: +SOB. Negative for chest pain, palpitations, diaphoresis, and peripheral edema. ; ; Respiratory: Negative for cough, wheezing and stridor. ; ; Gastrointestinal: Negative for nausea, vomiting, diarrhea, abdominal pain, blood in stool, hematemesis, jaundice and rectal bleeding. . ; ; Genitourinary: Negative for dysuria, flank pain and hematuria. ; ; Musculoskeletal: Negative for back pain and neck pain. Negative for swelling and trauma.; ; Skin: Negative for pruritus, rash, abrasions, blisters, bruising and skin lesion.; ; Neuro: Negative for headache, lightheadedness and neck stiffness. Negative for weakness, altered level of consciousness, altered mental status, extremity weakness, paresthesias, involuntary movement,  seizure and syncope.      Allergies  Lisinopril  Home Medications   Prior to Admission medications   Medication Sig Start Date End Date Taking? Authorizing Provider  amLODipine (NORVASC) 5 MG tablet Take 1 tablet (5 mg total) by mouth daily. 05/21/15   Wallis Bamberg, PA-C  apixaban (ELIQUIS) 5 MG TABS tablet Take 1 tablet (5 mg total) by mouth 2 (two) times daily. 08/21/14   Gwenlyn Found Copland, MD  labetalol (NORMODYNE) 100 MG tablet Take 1 tablet (100 mg total) by mouth 3 (three) times daily. 08/21/14   Gwenlyn Found Copland, MD   BP 204/120 mmHg  Pulse 114  Temp(Src) 97.8 F (36.6 C) (Oral)  Resp 32  SpO2 96%   Patient Vitals for the past 24 hrs:  BP Temp Temp src Pulse Resp SpO2  09/08/15 1100 151/99 mmHg - - 96 24 96 %  09/08/15 1030 154/99 mmHg - - 96 - 94 %  09/08/15 1000 (!) 193/173 mmHg - - 104 21 96 %  09/08/15 0926 (!) 204/120 mmHg - - 114 (!) 32 96 %  09/08/15 0916 (!) 187/125 mmHg 97.8 F (36.6 C) Oral 120 (!) 28 (!) 89 %    Physical Exam  0930: Physical examination:  Nursing notes reviewed; Vital signs and O2 SAT reviewed;  Constitutional: Well developed, Well nourished, Well hydrated, Uncomfortable appearing.;; Head:  Normocephalic, atraumatic; Eyes: EOMI, PERRL, No scleral icterus; ENMT: Mouth and pharynx normal, Mucous membranes moist; Neck: Supple, Full range of motion, No lymphadenopathy; Cardiovascular: Irregular rate and rhythm, No gallop; Respiratory: Breath  sounds coarse & equal bilaterally, No wheezes. Speaking phrases. Sitting upright. Tachypneic.; Chest: Nontender, Movement normal; Abdomen: Soft, Nontender, Nondistended, Normal bowel sounds; Genitourinary: No CVA tenderness; Extremities: Pulses normal, No tenderness, +2 pedal edema bilat. No calf asymmetry.; Neuro: AA&Ox3, Major CN grossly intact.  Speech clear. No gross focal motor or sensory deficits in extremities.; Skin: Color normal, Warm, Dry.   ED Course  Procedures (including critical care time) Labs  Review  Imaging Review  I have personally reviewed and evaluated these images and lab results as part of my medical decision-making.   EKG Interpretation   Date/Time:  Saturday September 08 2015 09:24:01 EDT Ventricular Rate:  109 PR Interval:    QRS Duration: 112 QT Interval:  373 QTC Calculation: 502 R Axis:   -54 Text Interpretation:  Atrial fibrillation Incomplete left bundle branch  block Prolonged QT interval No old tracing to compare Confirmed by [facility] LtdMCMANUS   MD, Nicholos JohnsKATHLEEN [phone]([id number]) on 09/08/2015 9:41:18 AM      MDM  MDM Reviewed: previous chart, nursing note and vitals Reviewed previous: labs and ECG Interpretation: labs, ECG and x-ray      Results for orders placed or performed during the hospital encounter of 09/08/15  Basic metabolic panel  Result Value Ref Range   Sodium 131 (L) 135 - 145 mmol/L   Potassium 3.9 3.5 - 5.1 mmol/L   Chloride 99 (L) 101 - 111 mmol/L   CO2 24 22 - 32 mmol/L   Glucose, Bld 123 (H) 65 - 99 mg/dL   BUN 11 6 - 20 mg/dL   Creatinine, Ser 6.041.25 (H) 0.61 - 1.24 mg/dL   Calcium 9.0 8.9 - 54.010.3 mg/dL   GFR calc non Af Amer 59 (L) >60 mL/min   GFR calc Af Amer >60 >60 mL/min   Anion gap 8 5 - 15  Brain natriuretic peptide  Result Value Ref Range   B Natriuretic Peptide 733.8 (H) 0.0 - 100.0 pg/mL  Troponin I  Result Value Ref Range   Troponin I 0.04 (H) <0.031 ng/mL  CBC with Differential  Result Value Ref Range   WBC 12.4 (H) 4.0 - 10.5 K/uL   RBC 4.18 (L) 4.22 - 5.81 MIL/uL   Hemoglobin 12.9 (L) 13.0 - 17.0 g/dL   HCT 98.138.0 (L) 19.139.0 - 47.852.0 %   MCV 90.9 78.0 - 100.0 fL   MCH 30.9 26.0 - 34.0 pg   MCHC 33.9 30.0 - 36.0 g/dL   RDW 29.514.2 62.111.5 - 30.815.5 %   Platelets 236 150 - 400 K/uL   Neutrophils Relative % 76 %   Neutro Abs 9.4 (H) 1.7 - 7.7 K/uL   Lymphocytes Relative 15 %   Lymphs Abs 1.8 0.7 - 4.0 K/uL   Monocytes Relative 6 %   Monocytes Absolute 0.8 0.1 - 1.0 K/uL   Eosinophils Relative 2 %   Eosinophils Absolute 0.3 0.0 - 0.7  K/uL   Basophils Relative 1 %   Basophils Absolute 0.1 0.0 - 0.1 K/uL  Urinalysis, Routine w reflex microscopic  Result Value Ref Range   Color, Urine AMBER (A) YELLOW   APPearance CLEAR CLEAR   Specific Gravity, Urine 1.016 1.005 - 1.030   pH 7.0 5.0 - 8.0   Glucose, UA NEGATIVE NEGATIVE mg/dL   Hgb urine dipstick SMALL (A) NEGATIVE   Bilirubin Urine SMALL (A) NEGATIVE   Ketones, ur NEGATIVE NEGATIVE mg/dL   Protein, ur 657100 (A) NEGATIVE mg/dL   Nitrite NEGATIVE NEGATIVE   Leukocytes, UA NEGATIVE NEGATIVE  Urine microscopic-add on  Result Value Ref Range   Squamous Epithelial / LPF 0-5 (A) NONE SEEN   WBC, UA NONE SEEN 0 - 5 WBC/hpf   RBC / HPF 0-5 0 - 5 RBC/hpf   Bacteria, UA FEW (A) NONE SEEN   Dg Chest Port 1 View 09/08/2015  CLINICAL DATA:  Shortness of breath EXAM: PORTABLE CHEST 1 VIEW COMPARISON:  September 05, 2010 FINDINGS: Cardiomegaly and mild edema identified. Elevation of the right hemidiaphragm remains. No other acute abnormalities. IMPRESSION: Cardiomegaly and mild edema. Electronically Signed   By: Gerome Sam III M.D   On: 09/08/2015 10:14    1120:   BNP elevated with mild edema on CXR; IV lasix and ntg paste applied. BP, HR and O2 Sats improved. Pt now 96% on O2 2L N/C from arrival O2 Sat of 89% R/A. Pt states he "feels a lot better now." Sitting upright, speaking full sentences, tachypnea and tachycardia improved, resps easy, NAD. Dx and testing d/w pt and family.  Questions answered.  Verb understanding, agreeable to admit. T/C to [facility]. Dartha Lodge, case discussed, including:  HPI, pertinent PM/SHx, VS/PE, dx testing, ED course and treatment:  Agreeable to admit, requests he will come to the ED for evaluation.    Samuel Jester, DO 09/09/15 1000

## 2015-09-09 ENCOUNTER — Inpatient Hospital Stay (HOSPITAL_COMMUNITY): Payer: Medicare Other

## 2015-09-09 DIAGNOSIS — I10 Essential (primary) hypertension: Secondary | ICD-10-CM

## 2015-09-09 DIAGNOSIS — I429 Cardiomyopathy, unspecified: Secondary | ICD-10-CM

## 2015-09-09 DIAGNOSIS — I481 Persistent atrial fibrillation: Secondary | ICD-10-CM

## 2015-09-09 DIAGNOSIS — I509 Heart failure, unspecified: Secondary | ICD-10-CM | POA: Insufficient documentation

## 2015-09-09 DIAGNOSIS — E8770 Fluid overload, unspecified: Secondary | ICD-10-CM

## 2015-09-09 LAB — ECHOCARDIOGRAM COMPLETE
E decel time: 187 msec
E/e' ratio: 6.68
FS: 20 % — AB (ref 28–44)
Height: 72 in
IVS/LV PW RATIO, ED: 0.98
LA ID, A-P, ES: 47 mm
LA diam end sys: 47 mm
LA diam index: 1.75 cm/m2
LA vol A4C: 101 ml
LA vol index: 46.5 mL/m2
LA vol: 125 mL
LV E/e' medial: 6.68
LV E/e'average: 6.68
LV PW d: 13.6 mm — AB (ref 0.6–1.1)
LV e' LATERAL: 13.5 cm/s
LVOT area: 4.52 cm2
LVOT diameter: 24 mm
MV Dec: 187
MV Peak grad: 3 mmHg
MV pk E vel: 90.2 m/s
TDI e' lateral: 13.5
TDI e' medial: 7.83
Weight: 4795.2 oz

## 2015-09-09 LAB — CBC
HEMATOCRIT: 35 % — AB (ref 39.0–52.0)
HEMOGLOBIN: 11.8 g/dL — AB (ref 13.0–17.0)
MCH: 30.6 pg (ref 26.0–34.0)
MCHC: 33.7 g/dL (ref 30.0–36.0)
MCV: 90.9 fL (ref 78.0–100.0)
Platelets: 227 10*3/uL (ref 150–400)
RBC: 3.85 MIL/uL — ABNORMAL LOW (ref 4.22–5.81)
RDW: 14.3 % (ref 11.5–15.5)
WBC: 10.3 10*3/uL (ref 4.0–10.5)

## 2015-09-09 LAB — BASIC METABOLIC PANEL
ANION GAP: 9 (ref 5–15)
BUN: 14 mg/dL (ref 6–20)
CO2: 27 mmol/L (ref 22–32)
Calcium: 8.6 mg/dL — ABNORMAL LOW (ref 8.9–10.3)
Chloride: 98 mmol/L — ABNORMAL LOW (ref 101–111)
Creatinine, Ser: 1.39 mg/dL — ABNORMAL HIGH (ref 0.61–1.24)
GFR calc Af Amer: 60 mL/min — ABNORMAL LOW (ref >60)
GFR calc non Af Amer: 52 mL/min — ABNORMAL LOW (ref >60)
GLUCOSE: 91 mg/dL (ref 65–99)
POTASSIUM: 3.5 mmol/L (ref 3.5–5.1)
Sodium: 134 mmol/L — ABNORMAL LOW (ref 135–145)

## 2015-09-09 LAB — TROPONIN I: Troponin I: 0.05 ng/mL — ABNORMAL HIGH (ref <0.031)

## 2015-09-09 MED ORDER — CARVEDILOL 12.5 MG PO TABS
12.5000 mg | ORAL_TABLET | Freq: Two times a day (BID) | ORAL | Status: DC
  Administered 2015-09-09 – 2015-09-11 (×4): 12.5 mg via ORAL
  Filled 2015-09-09 (×4): qty 1

## 2015-09-09 MED ORDER — RIVAROXABAN 20 MG PO TABS
20.0000 mg | ORAL_TABLET | Freq: Every day | ORAL | Status: DC
  Administered 2015-09-09 – 2015-09-10 (×2): 20 mg via ORAL
  Filled 2015-09-09 (×2): qty 1

## 2015-09-09 MED ORDER — PERFLUTREN LIPID MICROSPHERE
1.0000 mL | INTRAVENOUS | Status: AC | PRN
  Administered 2015-09-09: 3 mL via INTRAVENOUS
  Filled 2015-09-09: qty 10

## 2015-09-09 MED ORDER — FUROSEMIDE 10 MG/ML IJ SOLN: 40.0000 mg | Freq: Two times a day (BID) | INTRAMUSCULAR | Status: DC

## 2015-09-09 MED ORDER — POTASSIUM CHLORIDE CRYS ER 20 MEQ PO TBCR
40.0000 meq | EXTENDED_RELEASE_TABLET | Freq: Once | ORAL | Status: AC
  Administered 2015-09-09: 40 meq via ORAL
  Filled 2015-09-09: qty 2

## 2015-09-09 MED ORDER — ACETAMINOPHEN 325 MG PO TABS
650.0000 mg | ORAL_TABLET | Freq: Four times a day (QID) | ORAL | Status: DC | PRN
  Administered 2015-09-09 – 2015-09-10 (×3): 650 mg via ORAL
  Filled 2015-09-09 (×3): qty 2

## 2015-09-09 MED ORDER — FUROSEMIDE 10 MG/ML IJ SOLN
20.0000 mg | Freq: Two times a day (BID) | INTRAMUSCULAR | Status: DC
  Administered 2015-09-09: 20 mg via INTRAVENOUS
  Filled 2015-09-09: qty 2

## 2015-09-09 NOTE — Progress Notes (Signed)
Echocardiogram 2D Echocardiogram with Definity has been performed.  Nolon RodBrown, Tony 09/09/2015, 10:12 AM

## 2015-09-09 NOTE — Progress Notes (Signed)
ANTICOAGULATION CONSULT NOTE - Initial Consult  Pharmacy Consult for Xarelto Indication: atrial fibrillation  Allergies  Allergen Reactions  . Apixaban Other (See Comments)    Stiffness of neck and head  . Lisinopril Swelling    Swelling- angioedema    Patient Measurements: Height: 6' (182.9 cm) Weight: 299 lb 11.2 oz (135.943 kg) IBW/kg (Calculated) : 77.6  Vital Signs: Temp: 98.1 F (36.7 C) (06/11 0510) Temp Source: Oral (06/11 0510) BP: 138/91 mmHg (06/11 0510) Pulse Rate: 91 (06/11 0510)  Labs:  Recent Labs  09/08/15 0930 09/08/15 1440 09/08/15 2008 09/09/15 0148  HGB 12.9*  --   --   --   HCT 38.0*  --   --   --   PLT 236  --   --   --   CREATININE 1.25*  --   --   --   TROPONINI 0.04* 0.04* 0.05* 0.05*    Estimated Creatinine Clearance: 84.1 mL/min (by C-G formula based on Cr of 1.25).   Medical History: Past Medical History  Diagnosis Date  . Hypertension   . CHF (congestive heart failure) (HCC)   . Atrial fibrillation (HCC)   . Obesity    Assessment: 65 yo M admitted with HF exacerbation and hx Afib.  He was previously started on Eliquis, but stopped after a few doses due to neck & shoulder stiffness which he attributed to the new medication.   Pharmacy consulted to start Xarelto.   Currently on treatment dose Lovenox- last dose given at 0500 today. CBC reviewed- Hg slightly low, but stable.  No bleeding noted.  Patient also on aspirin 81mg . Renal fxn ok.  CrCl > 8850ml/min.    Plan:  Start Xarelto 20mg  oral daily with supper tonight Provide patient education on Xarelto Monitor CBC Consider stop aspirin while on Xarelto if clinically appropriate  Junita PushMichelle Jeronica Stlouis, PharmD, BCPS Pager: [phone] 09/09/2015,9:43 AM

## 2015-09-09 NOTE — Discharge Instructions (Signed)

## 2015-09-09 NOTE — Progress Notes (Signed)
PROGRESS NOTE    Tanner Gilbert  ZOX:[id number] DOB: [birth year] DOA: 09/08/2015 PCP: Abbe AmsterdamOPLAND,JESSICA, MD    Brief Narrative: 65 year old with PMH significant for A fib, was not able to tolerates eliquis, CHF unknown EF, who presents with dyspnea at rest and on exertion:      Assessment & Plan:   Active Problems:   CHF (congestive heart failure) (HCC)  1-Acute on Chronic Heart failure exacerbation; Awaiting ECHO to determine diastolic vs systolic.  Negative 3.9 L.  Change lasix to 20 mg IV BID. Mildly increase cr, continue to monitor.    2-A fib: Continue with coreg. Change Lovenox to xarelto as recommend by cardiology.   3-Mild elevated troponin; in setting of HF exacerbation.   4-HTN; discontinue Norvasc to avoid fluid retention. Continue with coreg, bidil.   5-Hyponatremia; related to HF. Await labs for this morning.      DVT prophylaxis: (Lovenox/Heparin/SCD's/anticoagulated/None (if comfort care) Code Status: (Full/Partial - specify details) Family Communication: (Specify name, relationship & date discussed. NO "discussed with patient") Disposition Plan: (specify when and where you expect patient to be discharged). Include barriers to DC in this tab.   Consultants:   Cardiology   Procedures:   ECHO pending.   Antimicrobials  none   Subjective: He is breathing better since last night. He was able to sleep better. Dyspnea improved. Denies chest pain  LE edema decreased. He has gain weight over lats couple weeks, unknown amount.   Objective: Filed Vitals:   09/08/15 1526 09/08/15 1529 09/08/15 2108 09/09/15 0510  BP: 161/115  148/81 138/91  Pulse:   103 91  Temp:   99.5 F (37.5 C) 98.1 F (36.7 C)  TempSrc: Oral  Oral Oral  Resp: 18  18 18   Height:  6' (1.829 m)    Weight:  137.531 kg (303 lb 3.2 oz)  135.943 kg (299 lb 11.2 oz)  SpO2: 95%  98% 98%    Intake/Output Summary (Last 24 hours) at 09/09/15 0932 Last data filed at 09/09/15 0225  Gross per 24 hour  Intake      0 ml  Output   3975 ml  Net  -3975 ml   Filed Weights   09/08/15 1529 09/09/15 0510  Weight: 137.531 kg (303 lb 3.2 oz) 135.943 kg (299 lb 11.2 oz)    Examination:  General exam: Appears calm and comfortable  Respiratory system:crackles bases ,Respiratory effort normal. Cardiovascular system: S1 & S2 heard, RRR. No JVD, murmurs, rubs, gallops or clicks. Trace  edema. Gastrointestinal system: Abdomen is nondistended, soft and nontender. No organomegaly or masses felt. Normal bowel sounds heard. Central nervous system: Alert and oriented. No focal neurological deficits. Extremities: Symmetric 5 x 5 power. Skin: No rashes, lesions or ulcers Psychiatry: Judgement and insight appear normal. Mood & affect appropriate.     Data Reviewed: I have personally reviewed following labs and imaging studies  CBC:  Recent Labs Lab 09/08/15 0930  WBC 12.4*  NEUTROABS 9.4*  HGB 12.9*  HCT 38.0*  MCV 90.9  PLT 236   Basic Metabolic Panel:  Recent Labs Lab 09/08/15 0930  NA 131*  K 3.9  CL 99*  CO2 24  GLUCOSE 123*  BUN 11  CREATININE 1.25*  CALCIUM 9.0   GFR: Estimated Creatinine Clearance: 84.1 mL/min (by C-G formula based on Cr of 1.25). Liver Function Tests: No results for input(s): AST, ALT, ALKPHOS, BILITOT, PROT, ALBUMIN in the last 168 hours. No results for input(s): LIPASE, AMYLASE in the last  168 hours. No results for input(s): AMMONIA in the last 168 hours. Coagulation Profile: No results for input(s): INR, PROTIME in the last 168 hours. Cardiac Enzymes:  Recent Labs Lab 09/08/15 0930 09/08/15 1440 09/08/15 2008 09/09/15 0148  TROPONINI 0.04* 0.04* 0.05* 0.05*   BNP (last 3 results) No results for input(s): PROBNP in the last 8760 hours. HbA1C: No results for input(s): HGBA1C in the last 72 hours. CBG: No results for input(s): GLUCAP in the last 168 hours. Lipid Profile: No results for input(s): CHOL, HDL, LDLCALC, TRIG,  CHOLHDL, LDLDIRECT in the last 72 hours. Thyroid Function Tests:  Recent Labs  09/08/15 1440  TSH 4.823*   Anemia Panel: No results for input(s): VITAMINB12, FOLATE, FERRITIN, TIBC, IRON, RETICCTPCT in the last 72 hours. Sepsis Labs: No results for input(s): PROCALCITON, LATICACIDVEN in the last 168 hours.  No results found for this or any previous visit (from the past 240 hour(s)).       Radiology Studies: Dg Chest Port 1 View  09/08/2015  CLINICAL DATA:  Shortness of breath EXAM: PORTABLE CHEST 1 VIEW COMPARISON:  September 05, 2010 FINDINGS: Cardiomegaly and mild edema identified. Elevation of the right hemidiaphragm remains. No other acute abnormalities. IMPRESSION: Cardiomegaly and mild edema. Electronically Signed   By: Gerome Sam III M.D   On: 09/08/2015 10:14        Scheduled Meds: . amLODipine  5 mg Oral Daily  . aspirin EC  81 mg Oral Daily  . carvedilol  6.25 mg Oral BID WC  . enoxaparin (LOVENOX) injection  1 mg/kg Subcutaneous Q12H  . furosemide  60 mg Intravenous BID  . isosorbide-hydrALAZINE  1 tablet Oral TID   Continuous Infusions:    LOS: 1 day    Time spent: 35 minutes.     Alba Cory, MD [facility] Pager [phone]  If 7PM-7AM, please contact night-coverage [email or web address] Password [email or web address] 09/09/2015, 9:32 AM

## 2015-09-09 NOTE — Progress Notes (Signed)
    Consulting cardiologist: Dr. Charlton HawsPeter Gilbert  Seen for followup: Atrial fibrillation, CHF  Subjective:    Feels better, breathing more easily. Not aware of any palpitations. No chest pain. Slept better last night.  Objective:   Temp:  [98.1 F (36.7 C)-99.5 F (37.5 C)] 98.1 F (36.7 C) (06/11 0510) Pulse Rate:  [91-125] 91 (06/11 0510) Resp:  [18-29] 18 (06/11 0510) BP: (111-193)/(81-173) 138/91 mmHg (06/11 0510) SpO2:  [94 %-98 %] 98 % (06/11 0510) Weight:  [299 lb 11.2 oz (135.943 kg)-303 lb 3.2 oz (137.531 kg)] 299 lb 11.2 oz (135.943 kg) (06/11 0510) Last BM Date: 09/07/15  Filed Weights   09/08/15 1529 09/09/15 0510  Weight: 303 lb 3.2 oz (137.531 kg) 299 lb 11.2 oz (135.943 kg)    Intake/Output Summary (Last 24 hours) at 09/09/15 0959 Last data filed at 09/09/15 0225  Gross per 24 hour  Intake      0 ml  Output   3975 ml  Net  -3975 ml    Telemetry: Atrial fibrillation with intermittent aberrant conduction and NSVT.  Exam:  General: Morbidly obese, no distress.  Lungs: Decreased breath sounds with scattered rhonchi.  Cardiac: Irregularly irregular, no obvious S3.  Abdomen: Obese, nontender.  Extremities: Chronic appearing edema.  Lab Results:  Basic Metabolic Panel:  Recent Labs Lab 09/08/15 0930  NA 131*  K 3.9  CL 99*  CO2 24  GLUCOSE 123*  BUN 11  CREATININE 1.25*  CALCIUM 9.0    CBC:  Recent Labs Lab 09/08/15 0930 09/09/15 0148  WBC 12.4* 10.3  HGB 12.9* 11.8*  HCT 38.0* 35.0*  MCV 90.9 90.9  PLT 236 227    Cardiac Enzymes:  Recent Labs Lab 09/08/15 1440 09/08/15 2008 09/09/15 0148  TROPONINI 0.04* 0.05* 0.05*    Chest x-ray 10/08/2015: FINDINGS: Cardiomegaly and mild edema identified. Elevation of the right hemidiaphragm remains. No other acute abnormalities.  IMPRESSION: Cardiomegaly and mild edema.  Medications:   Scheduled Medications: . aspirin EC  81 mg Oral Daily  . carvedilol  6.25 mg Oral BID WC    . furosemide  40 mg Intravenous BID  . isosorbide-hydrALAZINE  1 tablet Oral TID  . rivaroxaban  20 mg Oral Q supper     PRN Medications: perflutren lipid microspheres (DEFINITY) IV suspension   Assessment:   1. Atrial fibrillation, persistent versus chronic. CHADSVASC score 3. Intolerance to Eliquis in the past as noted in Dr. Fabio BeringNishan's consultation. Xarelto recommended as a possible anticoagulant. Otherwise need to continue to work on rate control.  2. Volume overload with possible history of cardiomyopathy, details are not clear. Suspect nonischemic cardiomyopathy, possibly tachycardia-mediated. Echocardiogram is pending at this time. Minor increase in troponin I with flat pattern not suggestive of ACS, consistent with CHF.  3. Essential hypertension.  4. Morbid obesity, question OSA.   Plan/Discussion:    Discussed with patient and son. Increase Coreg for additional heart rate control. He has been placed on Xarelto and is also on BiDil with IV diuresis continuing. Follow up on echocardiogram. Depending on clinical progress can hopefully stabilize medical regimen and then arrange close follow-up as an outpatient. Consider sleep study as an outpatient.  Tanner Gilbert, M.D., F.A.C.C.

## 2015-09-10 DIAGNOSIS — I5023 Acute on chronic systolic (congestive) heart failure: Secondary | ICD-10-CM | POA: Insufficient documentation

## 2015-09-10 LAB — BASIC METABOLIC PANEL
Anion gap: 9 (ref 5–15)
BUN: 18 mg/dL (ref 6–20)
CO2: 29 mmol/L (ref 22–32)
CREATININE: 1.56 mg/dL — AB (ref 0.61–1.24)
Calcium: 8.6 mg/dL — ABNORMAL LOW (ref 8.9–10.3)
Chloride: 95 mmol/L — ABNORMAL LOW (ref 101–111)
GFR calc Af Amer: 52 mL/min — ABNORMAL LOW (ref >60)
GFR, EST NON AFRICAN AMERICAN: 45 mL/min — AB (ref >60)
GLUCOSE: 103 mg/dL — AB (ref 65–99)
Potassium: 3.4 mmol/L — ABNORMAL LOW (ref 3.5–5.1)
SODIUM: 133 mmol/L — AB (ref 135–145)

## 2015-09-10 LAB — CBC
HCT: 34.3 % — ABNORMAL LOW (ref 39.0–52.0)
Hemoglobin: 11.8 g/dL — ABNORMAL LOW (ref 13.0–17.0)
MCH: 30.8 pg (ref 26.0–34.0)
MCHC: 34.4 g/dL (ref 30.0–36.0)
MCV: 89.6 fL (ref 78.0–100.0)
Platelets: 231 10*3/uL (ref 150–400)
RBC: 3.83 MIL/uL — ABNORMAL LOW (ref 4.22–5.81)
RDW: 14.1 % (ref 11.5–15.5)
WBC: 8.6 10*3/uL (ref 4.0–10.5)

## 2015-09-10 MED ORDER — POTASSIUM CHLORIDE CRYS ER 20 MEQ PO TBCR
40.0000 meq | EXTENDED_RELEASE_TABLET | Freq: Once | ORAL | Status: AC
  Administered 2015-09-10: 40 meq via ORAL
  Filled 2015-09-10: qty 2

## 2015-09-10 NOTE — Care Management Note (Signed)
Case Management Note  Patient Details  Name: Tanner Gilbert MRN: [id number] Date of Birth: 24-Nov-1950  Subjective/Objective: 65 y/o m admitted w/CHF,afib. From home.                   Action/Plan:d/c plan home.   Expected Discharge Date:                Expected Discharge Plan:  Home/Self Care  In-House Referral:     Discharge planning Services  CM Consult  Post Acute Care Choice:    Choice offered to:     DME Arranged:    DME Agency:     HH Arranged:    HH Agency:     Status of Service:  In process, will continue to follow  Medicare Important Message Given:    Date Medicare IM Given:    Medicare IM give by:    Date Additional Medicare IM Given:    Additional Medicare Important Message give by:     If discussed at Long Length of Stay Meetings, dates discussed:    Additional Comments:  Tanner Gilbert, Tanner Plemmons, RN 09/10/2015, 3:58 PM

## 2015-09-10 NOTE — Progress Notes (Signed)
Patient Profile: 65 y/o male admitted for atrial fibrillation and CHF.   Subjective: Feels better but still with mild dyspnea. No chest pain.   Objective: Vital signs in last 24 hours: Temp:  [98.4 F (36.9 C)-98.8 F (37.1 C)] 98.8 F (37.1 C) (06/12 0601) Pulse Rate:  [87-100] 87 (06/12 0601) Resp:  [10-18] 10 (06/12 0601) BP: (123-133)/(66-93) 123/93 mmHg (06/12 0601) SpO2:  [94 %-98 %] 94 % (06/12 0601) Weight:  [294 lb 8.6 oz (133.6 kg)] 294 lb 8.6 oz (133.6 kg) (06/12 0601) Last BM Date: 09/10/15  Intake/Output from previous day: 06/11 0701 - 06/12 0700 In: 240 [P.O.:240] Out: 3100 [Urine:3100] Intake/Output this shift: Total I/O In: -  Out: 200 [Urine:200]  Medications Current Facility-Administered Medications  Medication Dose Route Frequency Provider Last Rate Last Dose  . acetaminophen (TYLENOL) tablet 650 mg  650 mg Oral Q6H PRN Belkys A Regalado, MD   650 mg at 09/10/15 0609  . carvedilol (COREG) tablet 12.5 mg  12.5 mg Oral BID WC Jonelle Sidle, MD   12.5 mg at 09/10/15 0847  . isosorbide-hydrALAZINE (BIDIL) 20-37.5 MG per tablet 1 tablet  1 tablet Oral TID Barnetta Chapel, MD   1 tablet at 09/10/15 0900  . rivaroxaban (XARELTO) tablet 20 mg  20 mg Oral Q supper Phylliss Blakes, RPH   20 mg at 09/09/15 1610    PE: General appearance: alert, cooperative and no distress Neck: no carotid bruit and no JVD Lungs: clear to auscultation bilaterally Heart: irregularly irregular rhythm and regular rate Extremities: no LEE Pulses: 2+ and symmetric Skin: warm and dry Neurologic: Grossly normal  Lab Results:   Recent Labs  09/08/15 0930 09/09/15 0148 09/10/15 0447  WBC 12.4* 10.3 8.6  HGB 12.9* 11.8* 11.8*  HCT 38.0* 35.0* 34.3*  PLT 236 227 231   BMET  Recent Labs  09/08/15 0930 09/09/15 0148 09/10/15 0447  NA 131* 134* 133*  K 3.9 3.5 3.4*  CL 99* 98* 95*  CO2 GLUCOSE 123* 91 103*  BUN CREATININE 1.25* 1.39*  1.56*  CALCIUM 9.0 8.6* 8.6*   Cardiac Panel (last 3 results)  Recent Labs  09/08/15 1440 09/08/15 2008 09/09/15 0148  TROPONINI 0.04* 0.05* 0.05*    Studies 2D Echo 09/09/15 Study Conclusions  - Left ventricle: The cavity size was normal. There was mild  concentric hypertrophy. Systolic function was mildly to  moderately reduced. The estimated ejection fraction was in the  range of 40% to 45%. Mild diffuse hypokinesis. - Aortic valve: There was trivial regurgitation. - Mitral valve: There was mild regurgitation. - Left atrium: The atrium was severely dilated.   Assessment/Plan  Active Problems:   CHF (congestive heart failure) (HCC)   Acute on chronic congestive heart failure (HCC)   1. Atrial fibrillation: persistent versus chronic. Onset unknown. Rate is controlled with Coreg. Avoid use of Cardizem given LV dysfunction. CHADSVASC score is 3. He's had intolerance to Eliquis in the past as noted in Dr. Fabio Bering consultation. He is now on Xarelto for anticoagulation and tolerating ok. Consider attempt at DCCV to restore NSR. Will discuss with MD.   2. Acute Systolic HF: 2D echo revealed reduced EF of 40-45%. Mild diffuse hypokinesis noted along with mild MR and trivial AI.  No prior 2D echo for comparison. Suspect nonischemic cardiomyopathy, possibly tachycardia-mediated. Minor increase in troponin I with flat pattern not suggestive of ACS, consistent with CHF. He has diuresed well. -  3.1L out yesterday. I/Os net negative 7 L since admit. IV lasix is now on hold due to bump in SCr from 1.39 to 1.56. Change to PO, 40 mg/daily and reassess renal function in the am.  We discussed importance of a low sodium diet. Continue BB therapy with Coreg. He is not currently on an ACE/ARB given renal insufficieny. Continue Bidil for afterload reduction, in place of ACE/ARB.   3. Essential hypertension: BP is well controlled on current regimen.   4. Morbid obesity: question OSA. Recommend OP  sleep study.   5. Hypokalemia: K is 3.4 today. Supplement with K-dur.    LOS: 2 days    Brittainy M. Delmer IslamSimmons, PA-C 09/10/2015 11:39 AM   History and all data above reviewed.  Patient examined.  I agree with the findings as above.  Breathing better but not at baseline. The patient exam reveals ZOX:[id number]:Irregular  ,  Lungs: Clear  ,  Abd: Positive bowel sounds, no rebound no guarding, Ext Moderate edema  .  All available labs, radiology testing, previous records reviewed. Agree with documented assessment and plan. Acute on Chronic CHF:  Needs to keep his feet elevated.  Need to hold his IV Lasix today and started PO.  Discussed low salt and fluid management. No plan for DCCV until he has been on Xarelto for 3 weeks.   Fayrene FearingJames Eulene Pekar  1:56 PM  09/10/2015

## 2015-09-10 NOTE — Progress Notes (Signed)
PROGRESS NOTE    Tanner Gilbert  ZOX:[id number] DOB: March 06, 1951 DOA: 09/08/2015 PCP: Abbe Amsterdam, MD    Brief Narrative: 65 year old with PMH significant for A fib, was not able to tolerates eliquis, CHF unknown EF, who presents with dyspnea at rest and on exertion:      Assessment & Plan:   Active Problems:   CHF (congestive heart failure) (HCC)   Acute on chronic congestive heart failure (HCC)  1-Acute on Chronic Systolic Heart failure exacerbation;  ECHO : Ef 40 to 45 %, diffuse hypokinesis. Further evaluation per cardio.  Negative 7 L.  Hold lasix this am. Cardiologist to adjust. Cr increased to 1.5.    2-A fib: Continue with coreg. Change Lovenox to xarelto as recommend by cardiology.   3-Mild elevated troponin; in setting of HF exacerbation.   4-HTN; discontinue Norvasc to avoid fluid retention. Continue with coreg, bidil.   5-Hyponatremia; related to HF. Stable.  6-hypokalemia; replaced orally.  7-Mild elevated TSH; repeat outpatient in 4 weeks.     DVT prophylaxis: (Lovenox/Heparin/SCD's/anticoagulated/None (if comfort care) Code Status: (Full/Partial - specify details) Family Communication: (Specify name, relationship & date discussed. NO "discussed with patient") Disposition Plan: (specify when and where you expect patient to be discharged). Include barriers to DC in this tab.   Consultants:   Cardiology   Procedures:  ECHO : - Left ventricle: The cavity size was normal. There was mild  concentric hypertrophy. Systolic function was mildly to  moderately reduced. The estimated ejection fraction was in the  range of 40% to 45%. Mild diffuse hypokinesis. - Aortic valve: There was trivial regurgitation. - Mitral valve: There was mild regurgitation.  - Left atrium: The atrium was severely dilated.  Antimicrobials  none   Subjective: He is still complaining of SOB, specially when lying down. Denies chest pain    Objective: Filed Vitals:   09/09/15 0510 09/09/15 1451 09/09/15 2205 09/10/15 0601  BP: 138/91 133/83 127/66 123/93  Pulse: 91 90 100 87  Temp: 98.1 F (36.7 C) 98.4 F (36.9 C) 98.4 F (36.9 C) 98.8 F (37.1 C)  TempSrc: Oral Oral Oral Oral  Resp: Height:    6' (1.829 m)  Weight: 135.943 kg (299 lb 11.2 oz)   133.6 kg (294 lb 8.6 oz)  SpO2: 98% 98% 95% 94%    Intake/Output Summary (Last 24 hours) at 09/10/15 1254 Last data filed at 09/10/15 0949  Gross per 24 hour  Intake      0 ml  Output   2250 ml  Net  -2250 ml   Filed Weights   09/08/15 1529 09/09/15 0510 09/10/15 0601  Weight: 137.531 kg (303 lb 3.2 oz) 135.943 kg (299 lb 11.2 oz) 133.6 kg (294 lb 8.6 oz)    Examination:  General exam: Appears calm and comfortable  Respiratory system:crackles bases ,Respiratory effort normal. Cardiovascular system: S1 & S2 heard, RRR. No JVD, murmurs, rubs, gallops or clicks. Trace  edema. Gastrointestinal system: Abdomen is nondistended, soft and nontender. No organomegaly or masses felt. Normal bowel sounds heard. Central nervous system: Alert and oriented. No focal neurological deficits. Extremities: Symmetric 5 x 5 power. Skin: No rashes, lesions or ulcers Psychiatry: Judgement and insight appear normal. Mood & affect appropriate.     Data Reviewed: I have personally reviewed following labs and imaging studies  CBC:  Recent Labs Lab 09/08/15 0930 09/09/15 0148 09/10/15 0447  WBC 12.4* 10.3 8.6  NEUTROABS 9.4*  --   --  HGB 12.9* 11.8* 11.8*  HCT 38.0* 35.0* 34.3*  MCV 90.9 90.9 89.6  PLT 236 227 231   Basic Metabolic Panel:  Recent Labs Lab 09/08/15 0930 09/09/15 0148 09/10/15 0447  NA 131* 134* 133*  K 3.9 3.5 3.4*  CL 99* 98* 95*  CO2 24 27 29   GLUCOSE 123* 91 103*  BUN 11 14 18   CREATININE 1.25* 1.39* 1.56*  CALCIUM 9.0 8.6* 8.6*   GFR: Estimated Creatinine Clearance: 66.8 mL/min (by C-G formula based on Cr of 1.56). Liver Function Tests: No results for  input(s): AST, ALT, ALKPHOS, BILITOT, PROT, ALBUMIN in the last 168 hours. No results for input(s): LIPASE, AMYLASE in the last 168 hours. No results for input(s): AMMONIA in the last 168 hours. Coagulation Profile: No results for input(s): INR, PROTIME in the last 168 hours. Cardiac Enzymes:  Recent Labs Lab 09/08/15 0930 09/08/15 1440 09/08/15 2008 09/09/15 0148  TROPONINI 0.04* 0.04* 0.05* 0.05*   BNP (last 3 results) No results for input(s): PROBNP in the last 8760 hours. HbA1C: No results for input(s): HGBA1C in the last 72 hours. CBG: No results for input(s): GLUCAP in the last 168 hours. Lipid Profile: No results for input(s): CHOL, HDL, LDLCALC, TRIG, CHOLHDL, LDLDIRECT in the last 72 hours. Thyroid Function Tests:  Recent Labs  09/08/15 1440  TSH 4.823*   Anemia Panel: No results for input(s): VITAMINB12, FOLATE, FERRITIN, TIBC, IRON, RETICCTPCT in the last 72 hours. Sepsis Labs: No results for input(s): PROCALCITON, LATICACIDVEN in the last 168 hours.  No results found for this or any previous visit (from the past 240 hour(s)).       Radiology Studies: No results found.      Scheduled Meds: . carvedilol  12.5 mg Oral BID WC  . isosorbide-hydrALAZINE  1 tablet Oral TID  . potassium chloride  40 mEq Oral Once  . rivaroxaban  20 mg Oral Q supper   Continuous Infusions:    LOS: 2 days    Time spent: 35 minutes.     Alba Coryegalado, La Dibella A, MD [facility] Pager [phone]  If 7PM-7AM, please contact night-coverage [email or web address] Password [email or web address] 09/10/2015, 12:54 PM

## 2015-09-11 DIAGNOSIS — I5043 Acute on chronic combined systolic (congestive) and diastolic (congestive) heart failure: Secondary | ICD-10-CM

## 2015-09-11 LAB — BASIC METABOLIC PANEL
Anion gap: 8 (ref 5–15)
BUN: 16 mg/dL (ref 6–20)
CO2: 27 mmol/L (ref 22–32)
CREATININE: 1.31 mg/dL — AB (ref 0.61–1.24)
Calcium: 8.9 mg/dL (ref 8.9–10.3)
Chloride: 98 mmol/L — ABNORMAL LOW (ref 101–111)
GFR, EST NON AFRICAN AMERICAN: 56 mL/min — AB (ref >60)
Glucose, Bld: 109 mg/dL — ABNORMAL HIGH (ref 65–99)
POTASSIUM: 3.7 mmol/L (ref 3.5–5.1)
SODIUM: 133 mmol/L — AB (ref 135–145)

## 2015-09-11 LAB — CBC
HCT: 37.9 % — ABNORMAL LOW (ref 39.0–52.0)
Hemoglobin: 13 g/dL (ref 13.0–17.0)
MCH: 30.7 pg (ref 26.0–34.0)
MCHC: 34.3 g/dL (ref 30.0–36.0)
MCV: 89.6 fL (ref 78.0–100.0)
PLATELETS: 288 10*3/uL (ref 150–400)
RBC: 4.23 MIL/uL (ref 4.22–5.81)
RDW: 13.9 % (ref 11.5–15.5)
WBC: 8.7 10*3/uL (ref 4.0–10.5)

## 2015-09-11 MED ORDER — ISOSORB DINITRATE-HYDRALAZINE 20-37.5 MG PO TABS: 1.0000 | ORAL_TABLET | Freq: Three times a day (TID) | ORAL | Status: DC

## 2015-09-11 MED ORDER — FUROSEMIDE 40 MG PO TABS
40.0000 mg | ORAL_TABLET | Freq: Every day | ORAL | Status: DC
  Administered 2015-09-11: 40 mg via ORAL
  Filled 2015-09-11: qty 1

## 2015-09-11 MED ORDER — POTASSIUM CHLORIDE ER 20 MEQ PO TBCR: 20.0000 meq | EXTENDED_RELEASE_TABLET | Freq: Every day | ORAL | Status: DC

## 2015-09-11 MED ORDER — FUROSEMIDE 40 MG PO TABS: 40.0000 mg | ORAL_TABLET | Freq: Every day | ORAL | Status: DC

## 2015-09-11 MED ORDER — RIVAROXABAN 20 MG PO TABS: 20.0000 mg | ORAL_TABLET | Freq: Every day | ORAL | Status: DC

## 2015-09-11 MED ORDER — CARVEDILOL 12.5 MG PO TABS: 12.5000 mg | ORAL_TABLET | Freq: Two times a day (BID) | ORAL | Status: DC

## 2015-09-11 NOTE — Care Management Important Message (Signed)
Important Message  Patient Details  Name: Tanner Gilbert MRN: [id number] Date of Birth: 1950-12-09   Medicare Important Message Given:  Yes    Haskell FlirtJamison, Lysha Schrade 09/11/2015, 9:14 AMImportant Message  Patient Details  Name: Tanner Gilbert MRN: [id number] Date of Birth: 1950-12-09   Medicare Important Message Given:  Yes    Haskell FlirtJamison, Royanne Warshaw 09/11/2015, 9:14 AM

## 2015-09-11 NOTE — Progress Notes (Signed)
Patient Profile: 65 y/o male admitted for atrial fibrillation and CHF.   Subjective: Feels better. No major complaints. No chest pain.   Objective: Vital signs in last 24 hours: Temp:  [97.8 F (36.6 C)-98.5 F (36.9 C)] 98.5 F (36.9 C) (06/13 0541) Pulse Rate:  [88-98] 94 (06/13 0541) Resp:  [16-24] 24 (06/13 0541) BP: (122-157)/(76-113) 145/91 mmHg (06/13 0547) SpO2:  [95 %-100 %] 100 % (06/13 0541) Weight:  [293 lb 6.9 oz (133.1 kg)-293 lb 10.4 oz (133.2 kg)] 293 lb 6.9 oz (133.1 kg) (06/13 0500) Last BM Date: 09/10/15  Intake/Output from previous day: 06/12 0701 - 06/13 0700 In: 480 [P.O.:480] Out: 1100 [Urine:1100] Intake/Output this shift:    Medications Current Facility-Administered Medications  Medication Dose Route Frequency Provider Last Rate Last Dose  . acetaminophen (TYLENOL) tablet 650 mg  650 mg Oral Q6H PRN Belkys A Regalado, MD   650 mg at 09/10/15 1934  . carvedilol (COREG) tablet 12.5 mg  12.5 mg Oral BID WC Jonelle SidleSamuel G McDowell, MD   12.5 mg at 09/10/15 1636  . isosorbide-hydrALAZINE (BIDIL) 20-37.5 MG per tablet 1 tablet  1 tablet Oral TID Barnetta ChapelSylvester I Ogbata, MD   1 tablet at 09/10/15 2138  . rivaroxaban (XARELTO) tablet 20 mg  20 mg Oral Q supper Phylliss Blakesndrea M Lilliston, RPH   20 mg at 09/10/15 1636    PE: General appearance: alert, cooperative and no distress, moderately obese Neck: no carotid bruit and no JVD Lungs: clear to auscultation bilaterally Heart: irregularly irregular rhythm and regular rate Extremities: no pitting edema Pulses: 2+ and symmetric Skin: warm and dry Neurologic: Grossly normal  Lab Results:   Recent Labs  09/09/15 0148 09/10/15 0447  WBC 10.3 8.6  HGB 11.8* 11.8*  HCT 35.0* 34.3*  PLT 227 231   BMET  Recent Labs  09/09/15 0148 09/10/15 0447  NA 134* 133*  K 3.5 3.4*  CL 98* 95*  CO2 27 29  GLUCOSE 91 103*  BUN 14 18  CREATININE 1.39* 1.56*  CALCIUM 8.6* 8.6*   Cardiac Panel (last 3 results)  Recent  Labs  09/08/15 1440 09/08/15 2008 09/09/15 0148  TROPONINI 0.04* 0.05* 0.05*    Studies 2D Echo 09/09/15 Study Conclusions  - Left ventricle: The cavity size was normal. There was mild  concentric hypertrophy. Systolic function was mildly to  moderately reduced. The estimated ejection fraction was in the  range of 40% to 45%. Mild diffuse hypokinesis. - Aortic valve: There was trivial regurgitation. - Mitral valve: There was mild regurgitation. - Left atrium: The atrium was severely dilated.   Assessment/Plan  Active Problems:   CHF (congestive heart failure) (HCC)   Acute on chronic congestive heart failure (HCC)   Acute on chronic systolic heart failure (HCC)   1. Atrial fibrillation: persistent versus chronic. Onset unknown. Rate is controlled with Coreg. Avoid use of Cardizem given LV dysfunction. CHADSVASC score is 3. He's had intolerance to Eliquis in the past as noted in Dr. Fabio BeringNishan's consultation. He is now on Xarelto for anticoagulation and tolerating ok. We can reassess in clinic with repeat EKG in 3 weeks and consider OP DCCV after 3 weeks of uninterrupted anticoagulation with Xarelto, if still in afib. For now, continue rate control with BB + a/c for stoke prophylaxis.    2. Acute Systolic HF: 2D echo revealed reduced EF of 40-45%. Mild diffuse hypokinesis noted along with mild MR and trivial AI.  No prior 2D echo for comparison. Suspect nonischemic  cardiomyopathy, possibly tachycardia-mediated. Minor increase in troponin I with flat pattern not suggestive of ACS, consistent with CHF. He has diuresed well.  I/Os net negative 7.5 L since admit. IV lasix was d/c'ed yesterday due to bump in SCr from 1.39 to 1.56 and to PO lasix,  40 mg/daily was started. He diuresed another 1.1L on PO lasix yesterday. BMP pending. If stable renal function, would continue at current dose. We discussed importance of a low sodium diet. Continue BB therapy with Coreg. He is not currently on an  ACE/ARB given renal insufficieny. Continue Bidil for afterload reduction, in place of ACE/ARB.   3. Essential hypertension: BP is well controlled on current regimen.   4. Morbid obesity: question OSA. Recommend OP sleep study.   5. Hypokalemia: K was 3.4 yesterday. Supplemental K-dur was given. F/u BMP for today is pending.     LOS: 3 days    Brittainy M. Delmer Islam 09/11/2015 9:39 AM  History and all data above reviewed.  Patient examined.  I agree with the findings as above.  The patient exam reveals [id number]  ,  Lungs: Clear  ,  Abd: Positive bowel sounds, no rebound no guarding, Ext decreased Edema  .  All available labs, radiology testing, previous records reviewed. Agree with documented assessment and plan. Acute systolic and diastolic HF:  Improved.  On PO diuretic.   Creat is OK.  Plan DCCV in the weeks to come.    Fayrene Fearing [facility]  10:43 AM  09/11/2015

## 2015-09-11 NOTE — Care Management Note (Signed)
Case Management Note  Patient Details  Name: Tanner Gilbert MRN: [id number] Date of Birth: 05/28/1950  Subjective/Objective:  Per attending request-provided patient w/xarelto 30day free trial coupon-patient voiced understanding to take to his pharmacy.No further d/c needs.                  Action/Plan:d/c home.   Expected Discharge Date:                 Expected Discharge Plan:  Home/Self Care  In-House Referral:     Discharge planning Services  CM Consult  Post Acute Care Choice:    Choice offered to:     DME Arranged:    DME Agency:     HH Arranged:    HH Agency:     Status of Service:  Completed, signed off  Medicare Important Message Given:  Yes Date Medicare IM Given:    Medicare IM give by:    Date Additional Medicare IM Given:    Additional Medicare Important Message give by:     If discussed at Long Length of Stay Meetings, dates discussed:    Additional Comments:  Lanier ClamMahabir, Rayne Loiseau, RN 09/11/2015, 12:40 PM

## 2015-09-11 NOTE — Discharge Summary (Signed)
Physician Discharge Summary  Trentan Trippe ZOX:[id number] DOB: [birth year] DOA: 09/08/2015  PCP: Abbe Amsterdam, MD  Admit date: 09/08/2015 Discharge date: 09/11/2015  Admitted From: Home  Disposition: Discharge home   Recommendations for Outpatient Follow-up:  1. Follow up with PCP in 1-2 weeks 2. Please obtain BMP/CBC in one week, also need TSH in 4 weeks  3. Follow up with cardiology for further care HF, A fib   Home Health; no  Discharge Condition: Stable CODE STATUS: Full Code.  Diet recommendation: Heart Healthy   Brief/Interim Summary: 1-Acute on Chronic Systolic Heart failure exacerbation;  ECHO : Ef 40 to 45 %, diffuse hypokinesis. Further evaluation per cardio.  Negative 7 L.  Resume lasix, 40 mg daily Plan to discharge today   2-A fib: Continue with coreg. Change Lovenox to xarelto as recommend by cardiology.   3-Mild elevated troponin; in setting of HF exacerbation.   4-HTN; discontinue Norvasc to avoid fluid retention. Continue with coreg, bidil.   5-Hyponatremia; related to HF. Stable.  6-hypokalemia; replaced orally.  7-Mild elevated TSH; repeat outpatient in 4 weeks.   Discharge Diagnoses:  Active Problems:   CHF (congestive heart failure) (HCC)   Acute on chronic congestive heart failure (HCC)   Acute on chronic systolic heart failure [facility])    Discharge Instructions      Discharge Instructions    Diet - low sodium heart healthy    Complete by:  As directed      Increase activity slowly    Complete by:  As directed             Medication List    STOP taking these medications        amLODipine 5 MG tablet  Commonly known as:  NORVASC     apixaban 5 MG Tabs tablet  Commonly known as:  ELIQUIS     aspirin EC 81 MG tablet     labetalol 100 MG tablet  Commonly known as:  NORMODYNE      TAKE these medications        carvedilol 12.5 MG tablet  Commonly known as:  COREG  Take 1 tablet (12.5 mg total) by mouth 2 (two) times  daily with a meal.     furosemide 40 MG tablet  Commonly known as:  LASIX  Take 1 tablet (40 mg total) by mouth daily.     isosorbide-hydrALAZINE 20-37.5 MG tablet  Commonly known as:  BIDIL  Take 1 tablet by mouth 3 (three) times daily.     Potassium Chloride ER 20 MEQ Tbcr  Take 20 mEq by mouth daily.     rivaroxaban 20 MG Tabs tablet  Commonly known as:  XARELTO  Take 1 tablet (20 mg total) by mouth daily with supper.       Follow-up Information    Follow up with COPLAND,JESSICA, MD In 1 week.   Specialty:  Family Medicine   Contact information:   [address] [place] [address] [phone]       Follow up with Charlton Haws, MD In 1 week.   Specialty:  Cardiology   Contact information:   [address] [place] [address] [phone]      Allergies  Allergen Reactions  . Apixaban Other (See Comments)    Stiffness of neck and head  . Lisinopril Swelling    Swelling- angioedema    Consultations:  Cardiology    Procedures/Studies: Dg Chest Port 1 View  09/08/2015  CLINICAL DATA:  Shortness of breath EXAM: PORTABLE CHEST 1 VIEW COMPARISON:  September 05, 2010 FINDINGS: Cardiomegaly and mild edema identified. Elevation of the right hemidiaphragm remains. No other acute abnormalities. IMPRESSION: Cardiomegaly and mild edema. Electronically Signed   By: Gerome Samavid  Williams III M.D   On: 09/08/2015 10:14      Subjective: He is feeling better, he is ready to go home Dyspnea improved, no chest pain   Discharge Exam: Filed Vitals:   09/11/15 1000 09/11/15 1321  BP: 118/84 128/83  Pulse: 88 91  Temp:  98.2 F (36.8 C)  Resp:  20   Filed Vitals:   09/11/15 0541 09/11/15 0547 09/11/15 1000 09/11/15 1321  BP: 157/113 145/91 118/84 128/83  Pulse: 94  88 91  Temp: 98.5 F (36.9 C)   98.2 F (36.8 C)  TempSrc: Oral   Oral  Resp: 24   20  Height:      Weight:      SpO2: 100%   94%    General: Pt is alert, awake, not in  acute distress Cardiovascular: RRR, S1/S2 +, no rubs, no gallops Respiratory: CTA bilaterally, no wheezing, no rhonchi Abdominal: Soft, NT, ND, bowel sounds + Extremities: trace edema, no cyanosis    The results of significant diagnostics from this hospitalization (including imaging, microbiology, ancillary and laboratory) are listed below for reference.     Microbiology: No results found for this or any previous visit (from the past 240 hour(s)).   Labs: BNP (last 3 results)  Recent Labs  09/08/15 0930  BNP 733.8*   Basic Metabolic Panel:  Recent Labs Lab 09/08/15 0930 09/09/15 0148 09/10/15 0447 09/11/15 0805  NA 131* 134* 133* 133*  K 3.9 3.5 3.4* 3.7  CL 99* 98* 95* 98*  CO2 24 27 29 27   GLUCOSE 123* 91 103* 109*  BUN 11 14 18 16   CREATININE 1.25* 1.39* 1.56* 1.31*  CALCIUM 9.0 8.6* 8.6* 8.9   Liver Function Tests: No results for input(s): AST, ALT, ALKPHOS, BILITOT, PROT, ALBUMIN in the last 168 hours. No results for input(s): LIPASE, AMYLASE in the last 168 hours. No results for input(s): AMMONIA in the last 168 hours. CBC:  Recent Labs Lab 09/08/15 0930 09/09/15 0148 09/10/15 0447 09/11/15 0805  WBC 12.4* 10.3 8.6 8.7  NEUTROABS 9.4*  --   --   --   HGB 12.9* 11.8* 11.8* 13.0  HCT 38.0* 35.0* 34.3* 37.9*  MCV 90.9 90.9 89.6 89.6  PLT 236 227 231 288   Cardiac Enzymes:  Recent Labs Lab 09/08/15 0930 09/08/15 1440 09/08/15 2008 09/09/15 0148  TROPONINI 0.04* 0.04* 0.05* 0.05*   BNP: Invalid input(s): POCBNP CBG: No results for input(s): GLUCAP in the last 168 hours. D-Dimer No results for input(s): DDIMER in the last 72 hours. Hgb A1c No results for input(s): HGBA1C in the last 72 hours. Lipid Profile No results for input(s): CHOL, HDL, LDLCALC, TRIG, CHOLHDL, LDLDIRECT in the last 72 hours. Thyroid function studies  Recent Labs  09/08/15 1440  TSH 4.823*   Anemia work up No results for input(s): VITAMINB12, FOLATE, FERRITIN,  TIBC, IRON, RETICCTPCT in the last 72 hours. Urinalysis    Component Value Date/Time   COLORURINE AMBER* 09/08/2015 0955   APPEARANCEUR CLEAR 09/08/2015 0955   LABSPEC 1.016 09/08/2015 0955   PHURINE 7.0 09/08/2015 0955   GLUCOSEU NEGATIVE 09/08/2015 0955   HGBUR SMALL* 09/08/2015 0955   BILIRUBINUR SMALL* 09/08/2015 0955   KETONESUR NEGATIVE 09/08/2015 0955   PROTEINUR  100* 09/08/2015 0955   NITRITE NEGATIVE 09/08/2015 0955   LEUKOCYTESUR NEGATIVE 09/08/2015 0955   Sepsis Labs Invalid input(s): PROCALCITONIN,  WBC,  LACTICIDVEN Microbiology No results found for this or any previous visit (from the past 240 hour(s)).   Time coordinating discharge: Over 30 minutes  SIGNED:   Alba Cory, MD  [facility] 09/11/2015, 2:08 PM Pager [phone]  If 7PM-7AM, please contact night-coverage [email or web address] Password [email or web address]

## 2015-09-12 ENCOUNTER — Telehealth: Payer: Self-pay | Admitting: Behavioral Health

## 2015-09-12 NOTE — Telephone Encounter (Signed)
Attempted to reach patient for TCM/Hospital Follow-up. Left message for patient to return call when available.    

## 2015-09-13 NOTE — Telephone Encounter (Signed)
Left a message x 2 regarding TCM/Hospital Follow-up.

## 2015-09-21 ENCOUNTER — Other Ambulatory Visit: Payer: Self-pay | Admitting: Family Medicine

## 2015-09-28 ENCOUNTER — Other Ambulatory Visit: Payer: Self-pay | Admitting: Urgent Care

## 2015-10-08 ENCOUNTER — Ambulatory Visit (INDEPENDENT_AMBULATORY_CARE_PROVIDER_SITE_OTHER): Payer: Medicare Other | Admitting: Family Medicine

## 2015-10-08 ENCOUNTER — Encounter: Payer: Self-pay | Admitting: Family Medicine

## 2015-10-08 VITALS — BP 155/115 | HR 102 | Temp 98.7°F | Ht 72.0 in | Wt 298.6 lb

## 2015-10-08 DIAGNOSIS — E785 Hyperlipidemia, unspecified: Secondary | ICD-10-CM

## 2015-10-08 DIAGNOSIS — I5022 Chronic systolic (congestive) heart failure: Secondary | ICD-10-CM | POA: Diagnosis not present

## 2015-10-08 DIAGNOSIS — R7303 Prediabetes: Secondary | ICD-10-CM | POA: Diagnosis not present

## 2015-10-08 DIAGNOSIS — I481 Persistent atrial fibrillation: Secondary | ICD-10-CM | POA: Diagnosis not present

## 2015-10-08 DIAGNOSIS — Z23 Encounter for immunization: Secondary | ICD-10-CM

## 2015-10-08 DIAGNOSIS — I4819 Other persistent atrial fibrillation: Secondary | ICD-10-CM

## 2015-10-08 LAB — LIPID PANEL
CHOLESTEROL: 162 mg/dL (ref 0–200)
HDL: 34.9 mg/dL — ABNORMAL LOW (ref >39.00)
LDL CALC: 109 mg/dL — AB (ref 0–99)
NonHDL: 127.58
TRIGLYCERIDES: 91 mg/dL (ref 0.0–149.0)
Total CHOL/HDL Ratio: 5
VLDL: 18.2 mg/dL (ref 0.0–40.0)

## 2015-10-08 LAB — BRAIN NATRIURETIC PEPTIDE: PRO B NATRI PEPTIDE: 692 pg/mL — AB (ref 0.0–100.0)

## 2015-10-08 LAB — HEMOGLOBIN A1C: HEMOGLOBIN A1C: 5.9 % (ref 4.6–6.5)

## 2015-10-08 MED ORDER — RIVAROXABAN 20 MG PO TABS: 20.0000 mg | ORAL_TABLET | Freq: Every day | ORAL | Status: DC

## 2015-10-08 MED ORDER — ISOSORB DINITRATE-HYDRALAZINE 20-37.5 MG PO TABS: 1.0000 | ORAL_TABLET | Freq: Three times a day (TID) | ORAL | Status: DC

## 2015-10-08 MED ORDER — FUROSEMIDE 40 MG PO TABS: 40.0000 mg | ORAL_TABLET | Freq: Every day | ORAL | Status: DC

## 2015-10-08 MED ORDER — POTASSIUM CHLORIDE ER 20 MEQ PO TBCR: 20.0000 meq | EXTENDED_RELEASE_TABLET | Freq: Every day | ORAL | Status: DC

## 2015-10-08 MED ORDER — CARVEDILOL 12.5 MG PO TABS: 12.5000 mg | ORAL_TABLET | Freq: Two times a day (BID) | ORAL | Status: DC

## 2015-10-08 NOTE — Progress Notes (Signed)
Pre visit review using our clinic review tool, if applicable. No additional management support is needed unless otherwise documented below in the visit note. 

## 2015-10-08 NOTE — Progress Notes (Addendum)
[facility] [address], [address] [place], [place] [address] [phone]  Date:  10/08/2015   Name:  Tanner Gilbert   DOB:  1950-09-09   MRN:  [id number]  PCP:  Abbe Amsterdam, MD    Chief Complaint: Hospitalization Follow-up   History of Present Illness:  Tanner Gilbert is a 65 y.o. very pleasant male patient who presents with the following:  I had seen this pt about one year ago with a fib, started anticoagulation and referred to cardiology.  I was then not able to reach him again despite multiple calls and a certified letter, and he never did see cardiology or otherwise follow-up  Pt then presented to the hospital on 6/10 with SOB and swelling.  Was admitted until 6/13 with acute on chronic CHF.  He was diuresed 7 liters. EF of 40-45% 09/09/15 He needs to have a CMP/ CBC and TSH today per DC summary notes He was started on coreg, bidil, lasix, Kdur, continue xarelto  It appears that cardiology has been trying to contact him regarding follow-up and he does have an appt scheduled for this Wednesday   Today he states he is feeling  "great," his breathing is normal. He feels like he is breathing as well as he ever has He has trace pitting edema in his bilateral legs still but much better then when she was in the hospital He uses pillows under his legs for swelling; currently using 2 pillows under his head which is baseline for him He does not have any PND.    Wt Readings from Last 3 Encounters:  10/08/15 298 lb 9.6 oz (135.444 kg)  09/11/15 293 lb 6.9 oz (133.1 kg)  08/21/14 294 lb (133.358 kg)   Weight on admit was 303 lbs, it looks like DC weight was 293 He does not own a scale and is not weighing himself  Ok to [facility] on both phones- pt and his son  He never did get the zostavax rx last year and would like a replacement rx for same- he also needs pneumonia vaccine.   BP Readings from Last 3 Encounters:  10/08/15 155/115   09/11/15 128/83  08/21/14 148/90     Patient Active Problem List   Diagnosis Date Noted  . Acute on chronic systolic heart failure (HCC)   . Acute on chronic congestive heart failure (HCC)   . CHF (congestive heart failure) (HCC) 09/08/2015  . HTN (hypertension) with goal to be determined 09/08/2012  . Morbid obesity (HCC) 09/08/2012    Past Medical History  Diagnosis Date  . Hypertension   . CHF (congestive heart failure) (HCC)   . Atrial fibrillation (HCC)   . Obesity     No past surgical history on file.  Social History  Substance Use Topics  . Smoking status: Never Smoker   . Smokeless tobacco: None  . Alcohol Use: No    No family history on file.  Allergies  Allergen Reactions  . Apixaban Other (See Comments)    Stiffness of neck and head  . Lisinopril Swelling    Swelling- angioedema    Medication list has been reviewed and updated.  Current Outpatient Prescriptions on File Prior to Visit  Medication Sig Dispense Refill  . carvedilol (COREG) 12.5 MG tablet Take 1 tablet (12.5 mg total) by mouth 2 (two) times daily with a meal. 60 tablet 0  . furosemide (LASIX) 40 MG tablet Take 1 tablet (40 mg  total) by mouth daily. 30 tablet 0  . isosorbide-hydrALAZINE (BIDIL) 20-37.5 MG tablet Take 1 tablet by mouth 3 (three) times daily. 90 tablet 0  . labetalol (NORMODYNE) 100 MG tablet TAKE 1 TABLET THREE TIMES A DAY 270 tablet 2  . potassium chloride 20 MEQ TBCR Take 20 mEq by mouth daily. 30 tablet 0  . rivaroxaban (XARELTO) 20 MG TABS tablet Take 1 tablet (20 mg total) by mouth daily with supper. 30 tablet 0   No current facility-administered medications on file prior to visit.    Review of Systems:  As per HPI- otherwise negative.   Physical Examination: Filed Vitals:   10/08/15 1108 10/08/15 1111  BP: 154/125 155/115  Pulse: 102   Temp: 98.7 F (37.1 C)    Filed Vitals:   10/08/15 1108  Height: 6' (1.829 m)  Weight: 298 lb 9.6 oz (135.444 kg)    Body mass index is 40.49 kg/(m^2). Ideal Body Weight: Weight in (lb) to have BMI = 25: 183.9  GEN: WDWN, NAD, Non-toxic, A & O x 3, morbid obesity, looks well HEENT: Atraumatic, Normocephalic. Neck supple. No masses, No LAD.  Bilateral TM wnl, oropharynx normal.  PEERL,EOMI.   Ears and Nose: No external deformity. CV: irregular c/w a fib, rate around 100 BPM No M/G/R. No JVD. No thrill. No extra heart sounds. PULM: CTA B, no wheezes, crackles, rhonchi. No retractions. No resp. distress. No accessory muscle use.  No evidence of pulmonary edema today EXTR: No c/c.  Trace pitting edema to mid shin bilaterally  NEURO Normal gait.  PSYCH: Normally interactive. Conversant. Not depressed or anxious appearing.  Calm demeanor.    Assessment and Plan: Chronic systolic congestive heart failure (HCC) - Plan: CMP14+CBC/D/Plt+TSH, furosemide (LASIX) 40 MG tablet, isosorbide-hydrALAZINE (BIDIL) 20-37.5 MG tablet, Potassium Chloride ER 20 MEQ TBCR, B Nat Peptide  Persistent atrial fibrillation (HCC) - Plan: CMP14+CBC/D/Plt+TSH, carvedilol (COREG) 12.5 MG tablet, isosorbide-hydrALAZINE (BIDIL) 20-37.5 MG tablet, rivaroxaban (XARELTO) 20 MG TABS tablet  Pre-diabetes - Plan: Hemoglobin A1c, Lipid panel  Immunization due - Plan: Pneumococcal conjugate vaccine 13-valent IM  Dyslipidemia - Plan: Lipid panel  Here today to follow-up from recent hospital stay with CHF exacerbation Follow-up labs as above He is not weighing himself- stressed that he must get a scale and start weighing every morning Rate is high normal and BP is up. Will send a message to cardiology- ?increase his BB or add an ACE/ ARB  It was good to see you today- I'm glad that you are doing well!  Please purchase a scale- nothing expensive is necessary!  Weigh yourself each morning after you urinate and write down your weight. Your cardiologist will want you to monitor your weight to help watch for fluid changes   We may need to  increase your carvedilol dose- I will contact your cardiologist and ask them Continue all of your medications for now!  Your got your pneumonia vaccine today- wait at least 4 weeks prior to getting your shingles vaccine!   Signed Abbe Amsterdam, MD  Results for orders placed or performed in visit on 10/08/15  Hemoglobin A1c  Result Value Ref Range   Hgb A1c MFr Bld 5.9 4.6 - 6.5 %  Lipid panel  Result Value Ref Range   Cholesterol 162 0 - 200 mg/dL   Triglycerides 16.1 0.0 - 149.0 mg/dL   HDL 09.60 (L) >45.40 mg/dL   VLDL 98.1 0.0 - 19.1 mg/dL   LDL Cholesterol 478 (H) 0 - 99 mg/dL  Total CHOL/HDL Ratio 5    NonHDL 127.58   B Nat Peptide  Result Value Ref Range   Pro B Natriuretic peptide (BNP) 692.0 (H) 0.0 - 100.0 pg/mL   addnd 7/11 Received a message back from Philomena CourseK Thompson, PA-C who is scheduled to see pt tomorrow.  She is not going to be in the office and the pt was to be rescheduled.  It looks like they left a message for him a week ago and he never called back. Called [facility] to ask them to call this pt or his son again and reschedule asap.   Also called pt and LMOM with his son (per pt report he does not really know how to answer his phone and he has been very hard to reach in the past); be aware the cardiology appt is being rescheduled, please call if you do not hear about this.  In the meantime please increase coreg to 25 mg (2 tablets) twice daily to help better control rate and BP

## 2015-10-08 NOTE — Patient Instructions (Signed)
It was good to see you today- I'm glad that you are doing well!  Please purchase a scale- nothing expensive is necessary!  Weigh yourself each morning after you urinate and write down your weight. Your cardiologist will want you to monitor your weight to help watch for fluid changes   We may need to increase your carvedilol dose- I will contact your cardiologist and ask them Continue all of your medications for now!  Your got your pneumonia vaccine today- wait at least 4 months prior to getting your shingles vaccine!

## 2015-10-09 ENCOUNTER — Encounter: Payer: Self-pay | Admitting: Family Medicine

## 2015-10-09 LAB — BASIC METABOLIC PANEL
BUN: 15 mg/dL (ref 4–21)
Creatinine: 1.4 mg/dL — AB (ref 0.6–1.3)
Glucose: 100 mg/dL
Potassium: 4.5 mmol/L (ref 3.4–5.3)
Sodium: 135 mmol/L — AB (ref 137–147)

## 2015-10-09 LAB — TSH: TSH: 6.41 u[IU]/mL — AB (ref 0.41–5.90)

## 2015-10-09 LAB — HEPATIC FUNCTION PANEL
ALT: 18 U/L (ref 10–40)
AST: 20 U/L (ref 14–40)

## 2015-10-10 ENCOUNTER — Encounter: Payer: Self-pay | Admitting: Family Medicine

## 2015-10-10 ENCOUNTER — Encounter: Payer: Self-pay | Admitting: Physician Assistant

## 2015-10-10 ENCOUNTER — Encounter: Payer: Self-pay | Admitting: *Deleted

## 2015-10-10 ENCOUNTER — Encounter (INDEPENDENT_AMBULATORY_CARE_PROVIDER_SITE_OTHER): Payer: Self-pay

## 2015-10-10 ENCOUNTER — Ambulatory Visit (INDEPENDENT_AMBULATORY_CARE_PROVIDER_SITE_OTHER): Payer: Medicare Other | Admitting: Physician Assistant

## 2015-10-10 VITALS — Wt 295.6 lb

## 2015-10-10 DIAGNOSIS — I10 Essential (primary) hypertension: Secondary | ICD-10-CM | POA: Diagnosis not present

## 2015-10-10 DIAGNOSIS — I4891 Unspecified atrial fibrillation: Secondary | ICD-10-CM | POA: Diagnosis not present

## 2015-10-10 DIAGNOSIS — I5020 Unspecified systolic (congestive) heart failure: Secondary | ICD-10-CM

## 2015-10-10 LAB — CBC WITH DIFFERENTIAL/PLATELET
BASOS PCT: 0 %
Basophils Absolute: 0 cells/uL (ref 0–200)
EOS ABS: 744 {cells}/uL — AB (ref 15–500)
Eosinophils Relative: 8 %
HEMATOCRIT: 34.4 % — AB (ref 38.5–50.0)
HEMOGLOBIN: 12.1 g/dL — AB (ref 13.2–17.1)
LYMPHS ABS: 1488 {cells}/uL (ref 850–3900)
Lymphocytes Relative: 16 %
MCH: 30.5 pg (ref 27.0–33.0)
MCHC: 35.2 g/dL (ref 32.0–36.0)
MCV: 86.6 fL (ref 80.0–100.0)
MONO ABS: 744 {cells}/uL (ref 200–950)
MPV: 10.2 fL (ref 7.5–12.5)
Monocytes Relative: 8 %
NEUTROS PCT: 68 %
Neutro Abs: 6324 cells/uL (ref 1500–7800)
Platelets: 278 10*3/uL (ref 140–400)
RBC: 3.97 MIL/uL — AB (ref 4.20–5.80)
RDW: 13.6 % (ref 11.0–15.0)
WBC: 9.3 10*3/uL (ref 3.8–10.8)

## 2015-10-10 LAB — PROTIME-INR
INR: 1.1
Prothrombin Time: 11.9 s — ABNORMAL HIGH (ref 9.0–11.5)

## 2015-10-10 NOTE — Progress Notes (Signed)
 Cardiology Office Note    Date:  10/10/2015   ID:  Tanner Gilbert, DOB 02/23/1951, MRN [id number]  PCP:  Gilbert,JESSICA, MD  Cardiologist:  Nishan (seen during 08/2015 admission)   Chief Complaint: Hospital follow up s/p  CHF and afib  History of Present Illness:   Tanner Gilbert is a 65 y.o. male atrial fibrillation, HTN and chronic systolic CHF who presents for hospital follow up.    Seen by Dr Gilbert 2016 with afib and started on eliquis but had diffuse shoulder and neck stiffness and stopped after two doses. He was supposed to see Cardiology after wards but never made appt.  Presented to ER 09/08/15 with worsening SOB and LE swelling. He was also noted to have rate controlled afib. 2D echo revealed reduced EF of 40-45%. Mild diffuse hypokinesis noted along with mild MR and trivial AI. No prior 2D echo for comparison. Suspected nonischemic cardiomyopathy, possibly tachycardia-mediated. Minor increase in troponin I with flat pattern not suggestive of ACS, consistent with CHF. He is not currently on an ACE/ARB given renal insufficieny. Continued Bidil for afterload reduction. His fib felt persistent versus chronic. Onset unknown. Rate is controlled with Coreg. Avoid use of Cardizem given LV dysfunction. CHADSVASC score is 3. He was started on Xarelto plan was for OP DCCV after 3 weeks of uninterrupted anticoagulation with Xarelto, if still in afib. He was diuresed total 7 L.   Presents today for follow up. His dyspnea has resolved. No orthopnea or PND. No syncope. Still having intermittent LE Edema. No melena, blood in his stool or urine, chest pain or palpitations. Hasn't missed any dose of Xarelto. Compliant with diet and fluid intake.   Past Medical History  Diagnosis Date  . Hypertension   . CHF (congestive heart failure) (HCC)   . Atrial fibrillation (HCC)   . Obesity     No past surgical history on file.  Current Medications: Prior to Admission medications   Medication  Sig Start Date End Date Taking? Authorizing Provider  carvedilol (COREG) 12.5 MG tablet Take 1 tablet (12.5 mg total) by mouth 2 (two) times daily with a meal. 10/08/15   Tanner C Copland, MD  furosemide (LASIX) 40 MG tablet Take 1 tablet (40 mg total) by mouth daily. 10/08/15   Tanner C Copland, MD  isosorbide-hydrALAZINE (BIDIL) 20-37.5 MG tablet Take 1 tablet by mouth 3 (three) times daily. 10/08/15   Tanner C Copland, MD  Potassium Chloride ER 20 MEQ TBCR Take 20 mEq by mouth daily. 10/08/15   Tanner C Copland, MD  rivaroxaban (XARELTO) 20 MG TABS tablet Take 1 tablet (20 mg total) by mouth daily with supper. 10/08/15   Tanner C Copland, MD    Allergies:   Apixaban and Lisinopril   Social History   Social History  . Marital Status: Single    Spouse Name: N/A  . Number of Children: N/A  . Years of Education: N/A   Social History Main Topics  . Smoking status: Never Smoker   . Smokeless tobacco: None  . Alcohol Use: No  . Drug Use: No  . Sexual Activity: Yes   Other Topics Concern  . None   Social History Narrative     Family History:  The patient's family history is not on file.   ROS:   Please see the history of present illness.    ROS All other systems reviewed and are negative.   PHYSICAL EXAM:   VS:  Wt 295 lb 9.6 oz (134.083   kg)   GEN: Well nourished, well developed, in no acute distress HEENT: normal Neck: no JVD, carotid bruits, or masses Cardiac: IR IR tachycardic,  no murmurs, rubs, or gallops, trace BL LE edema, compression stocking Respiratory:  clear to auscultation bilaterally, normal work of breathing GI: soft, nontender, nondistended, + BS MS: no deformity or atrophy Skin: warm and dry, no rash Neuro:  Alert and Oriented x 3, Strength and sensation are intact Psych: euthymic mood, full affect  Wt Readings from Last 3 Encounters:  10/10/15 295 lb 9.6 oz (134.083 kg)  10/08/15 298 lb 9.6 oz (135.444 kg)  09/11/15 293 lb 6.9 oz (133.1 kg)       Studies/Labs Reviewed:   EKG:  EKG is ordered today.  The ekg ordered today demonstrates afib at rate of 106 bpm  Recent Labs: 09/08/2015: B Natriuretic Peptide 733.8*; TSH 4.823* 09/11/2015: BUN 16; Creatinine, Ser 1.31*; Hemoglobin 13.0; Platelets 288; Potassium 3.7; Sodium 133* 10/08/2015: Pro B Natriuretic peptide (BNP) 692.0*   Lipid Panel    Component Value Date/Time   CHOL 162 10/08/2015 1157   TRIG 91.0 10/08/2015 1157   HDL 34.90* 10/08/2015 1157   CHOLHDL 5 10/08/2015 1157   VLDL 18.2 10/08/2015 1157   LDLCALC 109* 10/08/2015 1157    Additional studies/ records that were reviewed today include:   Echocardiogram: 09/09/15 LV EF: 40% - 45%  ------------------------------------------------------------------- Indications: CHF - 428.0.  ------------------------------------------------------------------- History: PMH: Atrial fibrillation. Congestive heart failure. Risk factors: Hypertension. Obese.  ------------------------------------------------------------------- Study Conclusions  - Left ventricle: The cavity size was normal. There was mild  concentric hypertrophy. Systolic function was mildly to  moderately reduced. The estimated ejection fraction was in the  range of 40% to 45%. Mild diffuse hypokinesis. - Aortic valve: There was trivial regurgitation. - Mitral valve: There was mild regurgitation. - Left atrium: The atrium was severely dilated.   ASSESSMENT & PLAN:    1. Atrial fibrillation,  persistent versus chronic. Onset unknown.  - Has has 4 weeks of uninterrupted anticoagulation with Xarelto. As mention by Dr. Hochrein during discharge date 09/11/15 we will schedule for DCCV. Rate is minimally elevated, will increase coreg.   2. Chronic systolic CHF - 2D echo revealed reduced EF of 40-45%. Mild diffuse hypokinesis noted along with mild MR and trivial AI. No prior 2D echo for comparison. 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 BNP  of 692 (done by PCP yesterday). His BNP was 733.8 at admission. Lung clear and has trace BL LE edema. His weight has gradually declined at home. Weight of 295.6 lb today. Symptomatically improved. HF education given. He will keep log of daily weight and he will give as a call if weight is up 3lb in 24 hours or 5lb in week. Continue current dose of lasix 40mg qd. Advice to take extra 40mg if dyspnea, LE edema or weight gain.  - Continue BB and Bidil.   3. HTN - BP of 160/100 today. Will increased coreg. This will also helps to reduce his heart rate.   4. Morbid obesity - Encouraged weight loss, heart healthy diet and daily exercise. ? OSA. Consider sleep study during next office visit.     Medication Adjustments/Labs and Tests Ordered: Current medicines are reviewed at length with the patient today.  Concerns regarding medicines are outlined above.  Medication changes, Labs and Tests ordered today are listed in the Patient Instructions below. Patient Instructions  Medication Instructions:  INCREASE Coreg to 25mg to twice a day  STOP Coreg 12.5mg  Labwork:   Complete pt/inr, and cbc with diff  Testing/Procedures: Your physician has recommended that you have a Cardioversion (DCCV). Electrical Cardioversion uses a jolt of electricity to your heart either through paddles or wired patches attached to your chest. This is a controlled, usually prescheduled, procedure. Defibrillation is done under light anesthesia in the hospital, and you usually go home the day of the procedure. This is done to get your heart back into a normal rhythm. You are not awake for the procedure. Please see the instruction sheet given to you today.  Follow-Up: Your physician recommends that you schedule a follow-up appointment in: 2 wks with Vean Eowyn Tabone on July 26th or 27th  Electrical Cardioversion Electrical cardioversion is the delivery of a jolt of electricity to change the rhythm of the heart. Sticky patches or metal  paddles are placed on the chest to deliver the electricity from a device. This is done to restore a normal rhythm. A rhythm that is too fast or not regular keeps the heart from pumping well. Electrical cardioversion is done in an emergency if:   There is low or no blood pressure as a result of the heart rhythm.   Normal rhythm must be restored as fast as possible to protect the brain and heart from further damage.   It may save a life. Cardioversion may be done for heart rhythms that are not immediately life threatening, such as atrial fibrillation or flutter, in which:   The heart is beating too fast or is not regular.   Medicine to change the rhythm has not worked.   It is safe to wait in order to allow time for preparation.  Symptoms of the abnormal rhythm are bothersome.  The risk of stroke and other serious problems can be reduced. LET YOUR HEALTH CARE PROVIDER KNOW ABOUT:   Any allergies you have.  All medicines you are taking, including vitamins, herbs, eye drops, creams, and over-the-counter medicines.  Previous problems you or members of your family have had with the use of anesthetics.   Any blood disorders you have.   Previous surgeries you have had.   Medical conditions you have. RISKS AND COMPLICATIONS  Generally, this is a safe procedure. However, problems can occur and include:   Breathing problems related to the anesthetic used.  A blood clot that breaks free and travels to other parts of your body. This could cause a stroke or other problems. The risk of this is lowered by use of blood-thinning medicine (anticoagulant) prior to the procedure.  Cardiac arrest (rare). BEFORE THE PROCEDURE   You may have tests to detect blood clots in your heart and to evaluate heart function.  You may start taking anticoagulants so your blood does not clot as easily.   Medicines may be given to help stabilize your heart rate and rhythm. PROCEDURE  You will be  given medicine through an IV tube to reduce discomfort and make you sleepy (sedative).   An electrical shock will be delivered. AFTER THE PROCEDURE Your heart rhythm will be watched to make sure it does not change.    This information is not intended to replace advice given to you by your health care provider. Make sure you discuss any questions you have with your health care provider.   Document Released: 03/07/2002 Document Revised: 04/07/2014 Document Reviewed: 09/29/2012 Elsevier Interactive Patient Education 2016 Elsevier Inc[employer]    Any Other Special Instructions Will Be Listed Below (If Applicable).     If you need a refill   on your cardiac medications before your next appointment, please call your pharmacy.        Signed, Araceli Arango, PA  10/10/2015 12:05 PM    [facility] [address], [place], [place]  [address] Phone: [phone]; Fax: [phone]   

## 2015-10-10 NOTE — Patient Instructions (Addendum)
Medication Instructions:  Start Coreg 25mg  Take 1 tab twice a day  STOP Coreg 12.5mg   Labwork: Complete pt/inr, and cbc with diff  Testing/Procedures: Your physician has recommended that you have a Cardioversion (DCCV). Electrical Cardioversion uses a jolt of electricity to your heart either through paddles or wired patches attached to your chest. This is a controlled, usually prescheduled, procedure. Defibrillation is done under light anesthesia in the hospital, and you usually go home the day of the procedure. This is done to get your heart back into a normal rhythm. You are not awake for the procedure. Please see the instruction sheet given to you today.  Follow-Up: Your physician recommends that you schedule a follow-up appointment in: 2 wks with Chelsea Aus on July 26th or 27th  Electrical Cardioversion Electrical cardioversion is the delivery of a jolt of electricity to change the rhythm of the heart. Sticky patches or metal paddles are placed on the chest to deliver the electricity from a device. This is done to restore a normal rhythm. A rhythm that is too fast or not regular keeps the heart from pumping well. Electrical cardioversion is done in an emergency if:   There is low or no blood pressure as a result of the heart rhythm.   Normal rhythm must be restored as fast as possible to protect the brain and heart from further damage.   It may save a life. Cardioversion may be done for heart rhythms that are not immediately life threatening, such as atrial fibrillation or flutter, in which:   The heart is beating too fast or is not regular.   Medicine to change the rhythm has not worked.   It is safe to wait in order to allow time for preparation.  Symptoms of the abnormal rhythm are bothersome.  The risk of stroke and other serious problems can be reduced. LET [facility] CARE PROVIDER KNOW ABOUT:   Any allergies you have.  All medicines you are taking, including  vitamins, herbs, eye drops, creams, and over-the-counter medicines.  Previous problems you or members of your family have had with the use of anesthetics.   Any blood disorders you have.   Previous surgeries you have had.   Medical conditions you have. RISKS AND COMPLICATIONS  Generally, this is a safe procedure. However, problems can occur and include:   Breathing problems related to the anesthetic used.  A blood clot that breaks free and travels to other parts of your body. This could cause a stroke or other problems. The risk of this is lowered by use of blood-thinning medicine (anticoagulant) prior to the procedure.  Cardiac arrest (rare). BEFORE THE PROCEDURE   You may have tests to detect blood clots in your heart and to evaluate heart function.  You may start taking anticoagulants so your blood does not clot as easily.   Medicines may be given to help stabilize your heart rate and rhythm. PROCEDURE  You will be given medicine through an IV tube to reduce discomfort and make you sleepy (sedative).   An electrical shock will be delivered. AFTER THE PROCEDURE Your heart rhythm will be watched to make sure it does not change.    This information is not intended to replace advice given to you by your health care provider. Make sure you discuss any questions you have with your health care provider.   Document Released: 03/07/2002 Document Revised: 04/07/2014 Document Reviewed: 09/29/2012 Elsevier Interactive Patient Education [employer]    Any Other  Special Instructions Will Be Listed Below (If Applicable).     If you need a refill on your cardiac medications before your next appointment, please call your pharmacy.

## 2015-10-11 ENCOUNTER — Other Ambulatory Visit: Payer: Self-pay | Admitting: Family Medicine

## 2015-10-11 ENCOUNTER — Other Ambulatory Visit: Payer: Self-pay | Admitting: *Deleted

## 2015-10-11 MED ORDER — CARVEDILOL 25 MG PO TABS: 25.0000 mg | ORAL_TABLET | Freq: Two times a day (BID) | ORAL | Status: DC

## 2015-10-11 NOTE — Telephone Encounter (Signed)
Patient presented at the office and stated that he was seen in this office yesterday by Iver NestleBhagat, PA.  He says that he was told to stop the carvedilol 12.5 mg and start the 25 mg bid. He has been to the pharmacy twice and they still do not have the rx. This medication was not ordered nor was the 12.5 mg discontinued from his med list. I will update med list and send rx for him, but he requested only a thirty day supply at this time to see how he tolerates it.

## 2015-10-15 ENCOUNTER — Ambulatory Visit (HOSPITAL_COMMUNITY): Payer: Medicare Other | Admitting: Anesthesiology

## 2015-10-15 ENCOUNTER — Telehealth: Payer: Self-pay | Admitting: Cardiology

## 2015-10-15 ENCOUNTER — Encounter (HOSPITAL_COMMUNITY)
Admission: RE | Disposition: A | Discharge status: Home / Self Care | Payer: Self-pay | Source: Ambulatory Visit | Attending: Cardiology

## 2015-10-15 ENCOUNTER — Ambulatory Visit (HOSPITAL_COMMUNITY)
Admission: RE | Admit: 2015-10-15 | Discharge: 2015-10-15 | Disposition: A | Discharge status: Home / Self Care | Payer: Medicare Other | Source: Ambulatory Visit | Attending: Cardiology | Admitting: Cardiology

## 2015-10-15 ENCOUNTER — Encounter (HOSPITAL_COMMUNITY): Payer: Self-pay

## 2015-10-15 DIAGNOSIS — I110 Hypertensive heart disease with heart failure: Secondary | ICD-10-CM | POA: Insufficient documentation

## 2015-10-15 DIAGNOSIS — I5022 Chronic systolic (congestive) heart failure: Secondary | ICD-10-CM | POA: Insufficient documentation

## 2015-10-15 DIAGNOSIS — Z7901 Long term (current) use of anticoagulants: Secondary | ICD-10-CM | POA: Insufficient documentation

## 2015-10-15 DIAGNOSIS — E669 Obesity, unspecified: Secondary | ICD-10-CM | POA: Insufficient documentation

## 2015-10-15 DIAGNOSIS — I481 Persistent atrial fibrillation: Secondary | ICD-10-CM | POA: Diagnosis not present

## 2015-10-15 DIAGNOSIS — I4891 Unspecified atrial fibrillation: Secondary | ICD-10-CM | POA: Diagnosis not present

## 2015-10-15 DIAGNOSIS — I10 Essential (primary) hypertension: Secondary | ICD-10-CM | POA: Diagnosis not present

## 2015-10-15 DIAGNOSIS — I4819 Other persistent atrial fibrillation: Secondary | ICD-10-CM | POA: Insufficient documentation

## 2015-10-15 HISTORY — PX: CARDIOVERSION: SHX1299

## 2015-10-15 SURGERY — CARDIOVERSION
Anesthesia: General

## 2015-10-15 MED ORDER — SODIUM CHLORIDE 0.9 % IV SOLN
INTRAVENOUS | Status: DC | PRN
  Administered 2015-10-15: 13:00:00 via INTRAVENOUS

## 2015-10-15 MED ORDER — PROPOFOL 10 MG/ML IV BOLUS
INTRAVENOUS | Status: DC | PRN
  Administered 2015-10-15: 70 mg via INTRAVENOUS
  Administered 2015-10-15: 30 mg via INTRAVENOUS
  Administered 2015-10-15: 90 mg via INTRAVENOUS

## 2015-10-15 MED ORDER — LIDOCAINE 2% (20 MG/ML) 5 ML SYRINGE
INTRAMUSCULAR | Status: DC | PRN
  Administered 2015-10-15: 20 mg via INTRAVENOUS

## 2015-10-15 MED ORDER — SODIUM CHLORIDE 0.9 % IV SOLN: 250.0000 mL | INTRAVENOUS | Status: DC

## 2015-10-15 MED ORDER — SODIUM CHLORIDE 0.9% FLUSH: 3.0000 mL | Freq: Two times a day (BID) | INTRAVENOUS | Status: DC

## 2015-10-15 MED ORDER — SODIUM CHLORIDE 0.9% FLUSH: 3.0000 mL | INTRAVENOUS | Status: DC | PRN

## 2015-10-15 NOTE — Interval H&P Note (Signed)
History and Physical Interval Note:  10/15/2015 1:00 PM  Tanner Gilbert  has presented today for surgery, with the diagnosis of a fib  The various methods of treatment have been discussed with the patient and family. After consideration of risks, benefits and other options for treatment, the patient has consented to  Procedure(s): CARDIOVERSION (N/A) as a surgical intervention .  The patient's history has been reviewed, patient examined, no change in status, stable for surgery.  I have reviewed the patient's chart and labs.  Questions were answered to the patient's satisfaction.     Tanner Gilbert

## 2015-10-15 NOTE — Telephone Encounter (Signed)
Please have Dr. Fabio BeringNishan's nurse set patient up for afib clinic for failed cardioversion for afib

## 2015-10-15 NOTE — Discharge Instructions (Signed)
Electrical Cardioversion, Care After °Refer to this sheet in the next few weeks. These instructions provide you with information on caring for yourself after your procedure. Your health care provider may also give you more specific instructions. Your treatment has been planned according to current medical practices, but problems sometimes occur. Call your health care provider if you have any problems or questions after your procedure. °WHAT TO EXPECT AFTER THE PROCEDURE °After your procedure, it is typical to have the following sensations: °· Some redness on the skin where the shocks were delivered. If this is tender, a sunburn lotion or hydrocortisone cream may help. °· Possible return of an abnormal heart rhythm within hours or days after the procedure. °HOME CARE INSTRUCTIONS °· Take medicines only as directed by your health care provider. Be sure you understand how and when to take your medicine. °· Learn how to feel your pulse and check it often. °· Limit your activity for 48 hours after the procedure or as directed by your health care provider. °· Avoid or minimize caffeine and other stimulants as directed by your health care provider. °SEEK MEDICAL CARE IF: °· You feel like your heart is beating too fast or your pulse is not regular. °· You have any questions about your medicines. °· You have bleeding that will not stop. °SEEK IMMEDIATE MEDICAL CARE IF: °· You are dizzy or feel faint. °· It is hard to breathe or you feel short of breath. °· There is a change in discomfort in your chest. °· Your speech is slurred or you have trouble moving an arm or leg on one side of your body. °· You get a serious muscle cramp that does not go away. °· Your fingers or toes turn cold or blue. °  °This information is not intended to replace advice given to you by your health care provider. Make sure you discuss any questions you have with your health care provider. °  °Document Released: 01/05/2013 Document Revised: 04/07/2014  Document Reviewed: 01/05/2013 °Elsevier Interactive Patient Education ©2016 Elsevier Inc. ° °

## 2015-10-15 NOTE — Anesthesia Preprocedure Evaluation (Addendum)
Anesthesia Evaluation  Patient identified by MRN, date of birth, ID band Patient awake    Reviewed: Allergy & Precautions, NPO status , Patient's Chart, lab work & pertinent test results  Airway Mallampati: II  TM Distance: >3 FB Neck ROM: Full    Dental no notable dental hx.    Pulmonary neg pulmonary ROS,    Pulmonary exam normal breath sounds clear to auscultation       Cardiovascular hypertension, Pt. on medications + dysrhythmias Atrial Fibrillation  Rhythm:Irregular Rate:Normal  Left ventricle: The cavity size was normal. There was mild  concentric hypertrophy. Systolic function was mildly to  moderately reduced. The estimated ejection fraction was in the  range of 40% to 45%. Mild diffuse hypokinesis. - Aortic valve: There was trivial regurgitation. - Mitral valve: There was mild regurgitation. - Left atrium: The atrium was severely dilated   Neuro/Psych negative neurological ROS  negative psych ROS   GI/Hepatic negative GI ROS, Neg liver ROS,   Endo/Other  Morbid obesity  Renal/GU negative Renal ROS  negative genitourinary   Musculoskeletal negative musculoskeletal ROS (+)   Abdominal   Peds negative pediatric ROS (+)  Hematology negative hematology ROS (+)   Anesthesia Other Findings   Reproductive/Obstetrics negative OB ROS                           Anesthesia Physical Anesthesia Plan  ASA: III  Anesthesia Plan: General   Post-op Pain Management:    Induction: Intravenous  Airway Management Planned: Mask  Additional Equipment:   Intra-op Plan:   Post-operative Plan:   Informed Consent: I have reviewed the patients History and Physical, chart, labs and discussed the procedure including the risks, benefits and alternatives for the proposed anesthesia with the patient or authorized representative who has indicated his/her understanding and acceptance.   Dental  advisory given  Plan Discussed with: CRNA and Surgeon  Anesthesia Plan Comments:         Anesthesia Quick Evaluation

## 2015-10-15 NOTE — Transfer of Care (Signed)
Immediate Anesthesia Transfer of Care Note  Patient: Tanner Gilbert  Procedure(s) Performed: Procedure(s): CARDIOVERSION (N/A)  Patient Location: PACU and Endoscopy Unit  Anesthesia Type:MAC  Level of Consciousness: awake, alert , oriented and patient cooperative  Airway & Oxygen Therapy: Patient Spontanous Breathing and Patient connected to nasal cannula oxygen  Post-op Assessment: Report given to RN, Post -op Vital signs reviewed and stable and Patient moving all extremities  Post vital signs: Reviewed and stable  Last Vitals:  Filed Vitals:   10/15/15 1329 10/15/15 1330  BP:    Pulse: 87 98  Temp:    Resp: 33 34    Last Pain: There were no vitals filed for this visit.       Complications: No apparent anesthesia complications

## 2015-10-15 NOTE — CV Procedure (Addendum)
   Electrical Cardioversion Procedure Note Tanner Gilbert [id number] 01-08-1951  Procedure: Electrical Cardioversion Indications:  Atrial Fibrillation  Time Out: Verified patient identification, verified procedure,medications/allergies/relevent history reviewed, required imaging and test results available.  Performed  Procedure Details  The patient was NPO after midnight. Anesthesia was administered at the beside  by Dr.Joslin with 190mg  of propofol and Lidocaine 20mg .  Cardioversion was done with synchronized biphasic defibrillation with AP pads with 150watts.  The patient failed to convert to normal sinus rhythm.  Cardioversion was done with synchronized biphasic defibrillation with AP pads with 200watts. The patient converted to NSR for several beats and reverted back to atrial fibrillation.  Repeat Cardioversion was done with synchronized biphasic defibrillation with AP pads with 200watts.The patient converted to NSR but then reverted back to atrial fibrillation with CVR.  The patient tolerated the procedure well   IMPRESSION:  Successful cardioversion of atrial fibrillation but quickly reverted back to atrial fibrillation twice.  Will refer to Afib clinic for further evaluation.    Traci Turner 10/15/2015, 1:00 PM

## 2015-10-15 NOTE — H&P (View-Only) (Signed)
Cardiology Office Note    Date:  10/10/2015   ID:  Tanner Gilbert, DOB [birth year], MRN [id number]  PCP:  Tanner Amsterdam, Gilbert  Cardiologist:  Tanner Gilbert (seen during 08/2015 admission)   Chief Complaint: Hospital follow up s/p  CHF and afib  History of Present Illness:   Tanner Gilbert is a 65 y.o. male atrial fibrillation, HTN and chronic systolic CHF who presents for hospital follow up.    Seen by Tanner Gilbert 2016 with afib and started on eliquis but had diffuse shoulder and neck stiffness and stopped after two doses. He was supposed to see Cardiology after wards but never made appt.  Presented to ER 09/08/15 with worsening SOB and LE swelling. He was also noted to have rate controlled afib. 2D echo revealed reduced EF of 40-45%. Mild diffuse hypokinesis noted along with mild MR and trivial AI. No prior 2D echo for comparison. Suspected nonischemic cardiomyopathy, possibly tachycardia-mediated. Minor increase in troponin I with flat pattern not suggestive of ACS, consistent with CHF. He is not currently on an ACE/ARB given renal insufficieny. Continued Bidil for afterload reduction. His fib felt persistent versus chronic. Onset unknown. Rate is controlled with Coreg. Avoid use of Cardizem given LV dysfunction. CHADSVASC score is 3. He was started on Xarelto plan was for OP DCCV after 3 weeks of uninterrupted anticoagulation with Xarelto, if still in afib. He was diuresed total 7 L.   Presents today for follow up. His dyspnea has resolved. No orthopnea or PND. No syncope. Still having intermittent LE Edema. No melena, blood in his stool or urine, chest pain or palpitations. Hasn't missed any dose of Xarelto. Compliant with diet and fluid intake.   Past Medical History  Diagnosis Date  . Hypertension   . CHF (congestive heart failure) (HCC)   . Atrial fibrillation (HCC)   . Obesity     No past surgical history on file.  Current Medications: Prior to Admission medications   Medication  Sig Start Date End Date Taking? Authorizing Provider  carvedilol (COREG) 12.5 MG tablet Take 1 tablet (12.5 mg total) by mouth 2 (two) times daily with a meal. 10/08/15   Tanner Gilbert  furosemide (LASIX) 40 MG tablet Take 1 tablet (40 mg total) by mouth daily. 10/08/15   Tanner Gilbert  isosorbide-hydrALAZINE (BIDIL) 20-37.5 MG tablet Take 1 tablet by mouth 3 (three) times daily. 10/08/15   Tanner Gilbert  Potassium Chloride ER 20 MEQ TBCR Take 20 mEq by mouth daily. 10/08/15   Tanner Gilbert  rivaroxaban (XARELTO) 20 MG TABS tablet Take 1 tablet (20 mg total) by mouth daily with supper. 10/08/15   Tanner Gilbert    Allergies:   Apixaban and Lisinopril   Social History   Social History  . Marital Status: Single    Spouse Name: N/A  . Number of Children: N/A  . Years of Education: N/A   Social History Main Topics  . Smoking status: Never Smoker   . Smokeless tobacco: None  . Alcohol Use: No  . Drug Use: No  . Sexual Activity: Yes   Other Topics Concern  . None   Social History Narrative     Family History:  The patient's family history is not on file.   ROS:   Please see the history of present illness.    ROS All other systems reviewed and are negative.   PHYSICAL EXAM:   VS:  Wt 295 lb 9.6 oz (134.083  kg)   GEN: Well nourished, well developed, in no acute distress HEENT: normal Neck: no JVD, carotid bruits, or masses Cardiac: IR IR tachycardic,  no murmurs, rubs, or gallops, trace BL LE edema, compression stocking Respiratory:  clear to auscultation bilaterally, normal work of breathing GI: soft, nontender, nondistended, + BS MS: no deformity or atrophy Skin: warm and dry, no rash Neuro:  Alert and Oriented x 3, Strength and sensation are intact Psych: euthymic mood, full affect  Wt Readings from Last 3 Encounters:  10/10/15 295 lb 9.6 oz (134.083 kg)  10/08/15 298 lb 9.6 oz (135.444 kg)  09/11/15 293 lb 6.9 oz (133.1 kg)       Studies/Labs Reviewed:   EKG:  EKG is ordered today.  The ekg ordered today demonstrates afib at rate of 106 bpm  Recent Labs: 09/08/2015: B Natriuretic Peptide 733.8*; TSH 4.823* 09/11/2015: BUN 16; Creatinine, Ser 1.31*; Hemoglobin 13.0; Platelets 288; Potassium 3.7; Sodium 133* 10/08/2015: Pro B Natriuretic peptide (BNP) 692.0*   Lipid Panel    Component Value Date/Time   CHOL 162 10/08/2015 1157   TRIG 91.0 10/08/2015 1157   HDL 34.90* 10/08/2015 1157   CHOLHDL 5 10/08/2015 1157   VLDL 18.2 10/08/2015 1157   LDLCALC 109* 10/08/2015 1157    Additional studies/ records that were reviewed today include:   Echocardiogram: 09/09/15 LV EF: 40% - 45%  ------------------------------------------------------------------- Indications: CHF - 428.0.  ------------------------------------------------------------------- History: PMH: Atrial fibrillation. Congestive heart failure. Risk factors: Hypertension. Obese.  ------------------------------------------------------------------- Study Conclusions  - Left ventricle: The cavity size was normal. There was mild  concentric hypertrophy. Systolic function was mildly to  moderately reduced. The estimated ejection fraction was in the  range of 40% to 45%. Mild diffuse hypokinesis. - Aortic valve: There was trivial regurgitation. - Mitral valve: There was mild regurgitation. - Left atrium: The atrium was severely dilated.   ASSESSMENT & PLAN:    1. Atrial fibrillation,  persistent versus chronic. Onset unknown.  - Has has 4 weeks of uninterrupted anticoagulation with Xarelto. As mention by Tanner. Antoine Gilbert during discharge date 09/11/15 we will schedule for DCCV. Rate is minimally elevated, will increase coreg.   2. Chronic systolic CHF - 2D echo revealed reduced EF of 40-45%. Mild diffuse hypokinesis noted along with mild MR and trivial AI. No prior 2D echo for comparison. 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 BNP  of 692 (done by PCP yesterday). His BNP was 733.8 at admission. Lung clear and has trace BL LE edema. His weight has gradually declined at home. Weight of 295.6 lb today. Symptomatically improved. HF education given. He will keep log of daily weight and he will give as a call if weight is up 3lb in 24 hours or 5lb in week. Continue current dose of lasix  qd. Advice to take extra  if dyspnea, LE edema or weight gain.  - Continue BB and Bidil.   3. HTN - BP of 160/100 today. Will increased coreg. This will also helps to reduce his heart rate.   4. Morbid obesity - Encouraged weight loss, heart healthy diet and daily exercise. ? OSA. Consider sleep study during next office visit.     Medication Adjustments/Labs and Tests Ordered: Current medicines are reviewed at length with the patient today.  Concerns regarding medicines are outlined above.  Medication changes, Labs and Tests ordered today are listed in the Patient Instructions below. Patient Instructions  Medication Instructions:  INCREASE Coreg to  to twice a day  STOP Coreg 12.5mg   Labwork:  Complete pt/inr, and cbc with diff  Testing/Procedures: Your physician has recommended that you have a Cardioversion (DCCV). Electrical Cardioversion uses a jolt of electricity to your heart either through paddles or wired patches attached to your chest. This is a controlled, usually prescheduled, procedure. Defibrillation is done under light anesthesia in the hospital, and you usually go home the day of the procedure. This is done to get your heart back into a normal rhythm. You are not awake for the procedure. Please see the instruction sheet given to you today.  Follow-Up: Your physician recommends that you schedule a follow-up appointment in: 2 wks with Tanner Gilbert on July 26th or 27th  Electrical Cardioversion Electrical cardioversion is the delivery of a jolt of electricity to change the rhythm of the heart. Sticky patches or metal  paddles are placed on the chest to deliver the electricity from a device. This is done to restore a normal rhythm. A rhythm that is too fast or not regular keeps the heart from pumping well. Electrical cardioversion is done in an emergency if:   There is low or no blood pressure as a result of the heart rhythm.   Normal rhythm must be restored as fast as possible to protect the brain and heart from further damage.   It may save a life. Cardioversion may be done for heart rhythms that are not immediately life threatening, such as atrial fibrillation or flutter, in which:   The heart is beating too fast or is not regular.   Medicine to change the rhythm has not worked.   It is safe to wait in order to allow time for preparation.  Symptoms of the abnormal rhythm are bothersome.  The risk of stroke and other serious problems can be reduced. LET [facility] PROVIDER KNOW ABOUT:   Any allergies you have.  All medicines you are taking, including vitamins, herbs, eye drops, creams, and over-the-counter medicines.  Previous problems you or members of your family have had with the use of anesthetics.   Any blood disorders you have.   Previous surgeries you have had.   Medical conditions you have. RISKS AND COMPLICATIONS  Generally, this is a safe procedure. However, problems can occur and include:   Breathing problems related to the anesthetic used.  A blood clot that breaks free and travels to other parts of your body. This could cause a stroke or other problems. The risk of this is lowered by use of blood-thinning medicine (anticoagulant) prior to the procedure.  Cardiac arrest (rare). BEFORE THE PROCEDURE   You may have tests to detect blood clots in your heart and to evaluate heart function.  You may start taking anticoagulants so your blood does not clot as easily.   Medicines may be given to help stabilize your heart rate and rhythm. PROCEDURE  You will be  given medicine through an IV tube to reduce discomfort and make you sleepy (sedative).   An electrical shock will be delivered. AFTER THE PROCEDURE Your heart rhythm will be watched to make sure it does not change.    This information is not intended to replace advice given to you by your health care provider. Make sure you discuss any questions you have with your health care provider.   Document Released: 03/07/2002 Document Revised: 04/07/2014 Document Reviewed: 09/29/2012 Elsevier Interactive Patient Education 2016 [employer]    Any Other Special Instructions Will Be Listed Below (If Applicable).     If you need a refill  on your cardiac medications before your next appointment, please call your pharmacy.        Lorelei Pont, [place]  10/10/2015 12:05 PM    [facility] [address] [place], [place], [place]  [address] Phone: [phone]; Fax: [phone]

## 2015-10-15 NOTE — Anesthesia Postprocedure Evaluation (Signed)
Anesthesia Post Note  Patient: Tanner Gilbert  Procedure(s) Performed: Procedure(s) (LRB): CARDIOVERSION (N/A)  Patient location during evaluation: Endoscopy Anesthesia Type: General Level of consciousness: awake, awake and alert and oriented Pain management: pain level controlled Vital Signs Assessment: post-procedure vital signs reviewed and stable Respiratory status: spontaneous breathing, nonlabored ventilation and respiratory function stable Cardiovascular status: blood pressure returned to baseline Anesthetic complications: no    Last Vitals:  Filed Vitals:   10/15/15 1400 10/15/15 1407  BP: 134/90 143/88  Pulse: 95 86  Temp:    Resp: 20 22    Last Pain: There were no vitals filed for this visit.               Tiler Brandis COKER

## 2015-10-16 ENCOUNTER — Encounter (HOSPITAL_COMMUNITY): Payer: Self-pay | Admitting: Cardiology

## 2015-10-16 NOTE — Telephone Encounter (Signed)
Left message to sch appt

## 2015-10-16 NOTE — Telephone Encounter (Signed)
Sent message for [facility] Dr. Norris Crossurner's request.

## 2015-10-17 ENCOUNTER — Encounter: Payer: Self-pay | Admitting: Family Medicine

## 2015-10-18 ENCOUNTER — Telehealth: Payer: Self-pay | Admitting: Physician Assistant

## 2015-10-18 NOTE — Telephone Encounter (Signed)
Called pt and left message for pt to call back to update his Fm and medical Hx.

## 2015-10-19 NOTE — Telephone Encounter (Signed)
Left second message for appt to be scheduled.

## 2015-10-25 ENCOUNTER — Ambulatory Visit: Payer: Medicare Other | Admitting: Physician Assistant

## 2015-10-31 ENCOUNTER — Ambulatory Visit (HOSPITAL_COMMUNITY): Payer: Medicare Other | Admitting: Nurse Practitioner

## 2015-11-20 ENCOUNTER — Telehealth: Payer: Self-pay | Admitting: Emergency Medicine

## 2015-11-20 NOTE — Telephone Encounter (Signed)
Received a call from [facility] needing authorization for refills on Carvedilol, Potassium and Furosemide. Informed [facility] that these were already filled in 09/2015 with refills [facility]. Called pt to verify if he has [facility] these [facility] or if he still needs us to send the refill request to [facility]. No answer, left message for pt to return call.

## 2016-01-09 ENCOUNTER — Other Ambulatory Visit: Payer: Self-pay | Admitting: Emergency Medicine

## 2016-01-09 ENCOUNTER — Telehealth: Payer: Self-pay | Admitting: Family Medicine

## 2016-01-09 DIAGNOSIS — I5022 Chronic systolic (congestive) heart failure: Secondary | ICD-10-CM

## 2016-01-09 DIAGNOSIS — I4819 Other persistent atrial fibrillation: Secondary | ICD-10-CM

## 2016-01-09 MED ORDER — FUROSEMIDE 40 MG PO TABS: 40.0000 mg | ORAL_TABLET | Freq: Every day | ORAL | 0 refills | Status: DC

## 2016-01-09 MED ORDER — RIVAROXABAN 20 MG PO TABS: 20.0000 mg | ORAL_TABLET | Freq: Every day | ORAL | 0 refills | Status: DC

## 2016-01-09 MED ORDER — POTASSIUM CHLORIDE ER 20 MEQ PO TBCR: 20.0000 meq | EXTENDED_RELEASE_TABLET | Freq: Every day | ORAL | 0 refills | Status: DC

## 2016-01-09 MED ORDER — ISOSORB DINITRATE-HYDRALAZINE 20-37.5 MG PO TABS: 1.0000 | ORAL_TABLET | Freq: Three times a day (TID) | ORAL | 0 refills | Status: DC

## 2016-01-09 NOTE — Telephone Encounter (Signed)
Refills have been sent to pt's local pharmacy for 30 and to mail order for 90 days per pt request. Called to inform pt, no answer. Left message informing pt refills have been sent.

## 2016-01-09 NOTE — Telephone Encounter (Signed)
Pt walked into office. He is out of 4 medications and asking that they be sent into the pharmacy today. Furosemide, isosorbide-hydralazine, potassium chloride, and rivaroxaban.  He would like 30 days supply sent to local retail pharmacy [facility] [phone] - [place], [place] - [address] [facility]  He would like 90 days supply of each medicine sent to mail order [facility] [place], [place] - [phone]  Pt asked about dosing on carvedilol and had a full prescription. I advised pt to contact cardiology and provided phone #.

## 2016-04-30 ENCOUNTER — Telehealth: Payer: Self-pay | Admitting: Family Medicine

## 2016-04-30 ENCOUNTER — Telehealth: Payer: Self-pay

## 2016-04-30 NOTE — Telephone Encounter (Signed)
Caller name: Relationship to patient: Dr. Franky MachoLuke Johnson's Office Can be reached: [phone] Pharmacy:  Reason for call: Dr. Stark FallsLuke Johnson (Dentist) office need to know if patient needs to stop blood thinners prior to having an extraction. States they realized he is on 2 blood thinners and rescheduled his appointment until 2PM tomorrow (02/01). Plse adv

## 2016-04-30 NOTE — Telephone Encounter (Signed)
Called them back- the pt stopped his xarelto himself 4 days ago.  They plan to do an extraction. Advised staff that I defer to Dr. Henriette CombsJohnson's judgement about doing dental procedures but it would seem that as he has been of the xarelto this would a good time to take out the tooth

## 2016-05-07 ENCOUNTER — Other Ambulatory Visit: Payer: Self-pay

## 2016-05-07 ENCOUNTER — Telehealth: Payer: Self-pay | Admitting: Family Medicine

## 2016-05-07 DIAGNOSIS — I5022 Chronic systolic (congestive) heart failure: Secondary | ICD-10-CM

## 2016-05-07 DIAGNOSIS — I4819 Other persistent atrial fibrillation: Secondary | ICD-10-CM

## 2016-05-07 MED ORDER — RIVAROXABAN 20 MG PO TABS: 20.0000 mg | ORAL_TABLET | Freq: Every day | ORAL | 0 refills | Status: DC

## 2016-05-07 MED ORDER — ISOSORB DINITRATE-HYDRALAZINE 20-37.5 MG PO TABS: 1.0000 | ORAL_TABLET | Freq: Three times a day (TID) | ORAL | 0 refills | Status: DC

## 2016-05-07 MED ORDER — CARVEDILOL 25 MG PO TABS: 25.0000 mg | ORAL_TABLET | Freq: Two times a day (BID) | ORAL | 0 refills | Status: DC

## 2016-05-07 MED ORDER — POTASSIUM CHLORIDE ER 20 MEQ PO TBCR: 20.0000 meq | EXTENDED_RELEASE_TABLET | Freq: Every day | ORAL | 0 refills | Status: DC

## 2016-05-07 MED ORDER — FUROSEMIDE 40 MG PO TABS: 40.0000 mg | ORAL_TABLET | Freq: Every day | ORAL | 0 refills | Status: DC

## 2016-05-07 MED ORDER — CARVEDILOL 25 MG PO TABS: 25.0000 mg | ORAL_TABLET | Freq: Two times a day (BID) | ORAL | 11 refills | Status: DC

## 2016-05-07 MED ORDER — ISOSORB DINITRATE-HYDRALAZINE 20-37.5 MG PO TABS
1.0000 | ORAL_TABLET | Freq: Three times a day (TID) | ORAL | 0 refills | Status: DC
Start: 2016-05-07 — End: 2016-05-07

## 2016-05-07 NOTE — Telephone Encounter (Signed)
[facility] and [facility] per patient request.

## 2016-05-07 NOTE — Telephone Encounter (Signed)
Relation to RU:EAVWpt:self Call back [phone]   Reason for call:   Patient requesting 90 day supply for the following medication please send to mail order:  [facility] [place], [place] - [address] [phone] (Phone) [phone] (Fax)    rivaroxaban (XARELTO) 20 MG TABS tablet  Potassium Chloride ER 20 MEQ TBCR  isosorbide-hydrALAZINE (BIDIL) 20-37.5 MG tablet  furosemide (LASIX) 40 MG tablet  carvedilol (COREG) 25 MG tablet    Patient requesting a 2 week supply of the above medication please send to retail to hold patient over until mail order received  [facility] [phone] [place] - [address] [facility] [address] [facility] [address]  [phone] (Phone) [phone] (Fax)   Patient scheduled appointment for 05/19/16 at 11:45am with PCP.

## 2016-05-08 ENCOUNTER — Other Ambulatory Visit: Payer: Self-pay | Admitting: Family Medicine

## 2016-05-08 DIAGNOSIS — I4819 Other persistent atrial fibrillation: Secondary | ICD-10-CM

## 2016-05-08 DIAGNOSIS — I5022 Chronic systolic (congestive) heart failure: Secondary | ICD-10-CM

## 2016-05-19 ENCOUNTER — Ambulatory Visit: Payer: Medicare Other | Admitting: Family Medicine

## 2016-05-21 ENCOUNTER — Ambulatory Visit: Payer: Medicare Other | Admitting: Family Medicine

## 2016-09-24 ENCOUNTER — Other Ambulatory Visit: Payer: Self-pay | Admitting: Family Medicine

## 2016-09-24 DIAGNOSIS — I4819 Other persistent atrial fibrillation: Secondary | ICD-10-CM

## 2016-09-24 DIAGNOSIS — I5022 Chronic systolic (congestive) heart failure: Secondary | ICD-10-CM

## 2016-10-22 ENCOUNTER — Other Ambulatory Visit: Payer: Self-pay | Admitting: Physician Assistant

## 2016-11-07 ENCOUNTER — Other Ambulatory Visit: Payer: Self-pay | Admitting: Family Medicine

## 2016-11-07 DIAGNOSIS — I5022 Chronic systolic (congestive) heart failure: Secondary | ICD-10-CM

## 2016-12-12 ENCOUNTER — Other Ambulatory Visit: Payer: Self-pay | Admitting: Family Medicine

## 2016-12-12 ENCOUNTER — Other Ambulatory Visit: Payer: Self-pay | Admitting: Emergency Medicine

## 2016-12-12 DIAGNOSIS — I5022 Chronic systolic (congestive) heart failure: Secondary | ICD-10-CM

## 2016-12-12 DIAGNOSIS — I4819 Other persistent atrial fibrillation: Secondary | ICD-10-CM

## 2016-12-19 ENCOUNTER — Other Ambulatory Visit: Payer: Self-pay | Admitting: Emergency Medicine

## 2016-12-19 DIAGNOSIS — I5022 Chronic systolic (congestive) heart failure: Secondary | ICD-10-CM

## 2016-12-19 MED ORDER — POTASSIUM CHLORIDE ER 20 MEQ PO TBCR: 20.0000 meq | EXTENDED_RELEASE_TABLET | Freq: Every day | ORAL | 0 refills | Status: AC

## 2017-01-02 ENCOUNTER — Other Ambulatory Visit: Payer: Self-pay | Admitting: Emergency Medicine

## 2017-03-12 ENCOUNTER — Other Ambulatory Visit: Payer: Self-pay | Admitting: Family Medicine

## 2017-03-12 DIAGNOSIS — I4819 Other persistent atrial fibrillation: Secondary | ICD-10-CM

## 2017-03-12 DIAGNOSIS — I5022 Chronic systolic (congestive) heart failure: Secondary | ICD-10-CM

## 2017-07-10 ENCOUNTER — Other Ambulatory Visit: Payer: Self-pay | Admitting: Family Medicine

## 2017-07-10 DIAGNOSIS — I5022 Chronic systolic (congestive) heart failure: Secondary | ICD-10-CM

## 2017-07-10 DIAGNOSIS — I4819 Other persistent atrial fibrillation: Secondary | ICD-10-CM

## 2017-07-10 NOTE — Telephone Encounter (Signed)
Copied from CRM [id number]. Topic: Quick Communication - See Telephone Encounter >> Jul 10, 2017  2:11 PM Valentina LucksMatos, Jackelin wrote: CRM for notification. See Telephone encounter for: 07/10/17.   Pt came in office stating is needing refill  ASAP on rx for XARELTO 20 MG TABS tablet,  carvedilol (COREG) 25 MG tablet and furosemide (LASIX) 40 MG tablet for a 30 day supply since pt states is completely out of meds for these three rx. Pt states will continue with his other rx [facility]. Please advise. Please call pt at [phone] ASAP. ( [facility] [place] off [address])

## 2017-07-13 NOTE — Telephone Encounter (Signed)
Pt following up on a 30 day supply of his meds to  [facility] [phone] [place], [place] - [address] AT [facility] [address] [phone] (Phone) [phone] (Fax)    ([facility]) Pt states he took his last pill on Friday am. wuld like done today please

## 2017-07-13 NOTE — Telephone Encounter (Signed)
Pt called to check status of Rx.

## 2017-07-14 MED ORDER — CARVEDILOL 25 MG PO TABS: 25.0000 mg | ORAL_TABLET | Freq: Two times a day (BID) | ORAL | 0 refills | Status: AC

## 2017-07-14 MED ORDER — RIVAROXABAN 20 MG PO TABS
ORAL_TABLET | ORAL | 0 refills | Status: AC
Start: 2017-07-14 — End: ?

## 2017-07-14 MED ORDER — FUROSEMIDE 40 MG PO TABS: 40.0000 mg | ORAL_TABLET | Freq: Every day | ORAL | 0 refills | Status: AC

## 2017-07-14 NOTE — Progress Notes (Deleted)
[facility] [address], [address] [place], [place] [address] [phone] [phone]  Date:  07/16/2017   Name:  Tanner Gilbert Neff   DOB:  [birth year]   MRN:  [id number]  PCP:  Pearline Cablesopland, Greogory Cornette C, MD    Chief Complaint: No chief complaint on file.   History of Present Illness:  Tanner Gilbert Mikelson is a 67 y.o. very pleasant male patient who presents with the following:  Following up today History of HTN, obesity, CHF, a fib Last seen by myself in July of 2017:  I had seen this pt about one year ago with a fib, started anticoagulation and referred to cardiology.  I was then not able to reach him again despite multiple calls and a certified letter, and he never did see cardiology or otherwise follow-up Pt then presented to the hospital on 6/10 with SOB and swelling.  Was admitted until 6/13 with acute on chronic CHF.  He was diuresed 7 liters. EF of 40-45% 09/09/15 He needs to have a CMP/ CBC and TSH today per DC summary notes He was started on coreg, bidil, lasix, Kdur, continue xarelto It appears that cardiology has been trying to contact him regarding follow-up and he does have an appt scheduled for this Wednesday  Today he states he is feeling  "great," his breathing is normal. He feels like he is breathing as well as he ever has He has trace pitting edema in his bilateral legs still but much better then when she was in the hospital He uses pillows under his legs for swelling; currently using 2 pillows under his head which is baseline for him He does not have any PND.    Weight on admit was 303 lbs, it looks like DC weight was 293 He does not own a scale and is not weighing himself  There are no visits in chart since he had cardioversion in July of 2017           Patient Active Problem List   Diagnosis Date Noted  . Persistent atrial fibrillation (HCC)   . Acute on chronic systolic heart failure (HCC)   . Acute on chronic  congestive heart failure (HCC)   . CHF (congestive heart failure) (HCC) 09/08/2015  . HTN (hypertension) with goal to be determined 09/08/2012  . Morbid obesity (HCC) 09/08/2012    Past Medical History:  Diagnosis Date  . Atrial fibrillation (HCC)   . CHF (congestive heart failure) (HCC)   . Hypertension   . Obesity     Past Surgical History:  Procedure Laterality Date  . CARDIOVERSION N/A 10/15/2015   Procedure: CARDIOVERSION;  Surgeon: Quintella Reichertraci R Turner, MD;  Location: [facility];  Service: Cardiovascular;  Laterality: N/A;    Social History   Tobacco Use  . Smoking status: Never Smoker  Substance Use Topics  . Alcohol use: No  . Drug use: No    No family history on file.  Allergies  Allergen Reactions  . Apixaban Other (See Comments)    Stiffness of neck and head  . Lisinopril Swelling    Swelling- angioedema    Medication list has been reviewed and updated.  Current Outpatient Medications on File Prior to Visit  Medication Sig Dispense Refill  . BIDIL 20-37.5 MG tablet TAKE 1 TABLET THREE TIMES A DAY 270 tablet 0  . carvedilol (COREG) 25 MG tablet Take 1 tablet (25 mg total) by mouth 2 (two) times daily with a meal.  60 tablet 0  . furosemide (LASIX) 40 MG tablet Take 1 tablet (40 mg total) by mouth daily. 30 tablet 0  . isosorbide-hydrALAZINE (BIDIL) 20-37.5 MG tablet Take 1 tablet by mouth 3 (three) times daily. 30 tablet 0  . Potassium Chloride ER 20 MEQ TBCR Take 20 mEq by mouth daily. 90 tablet 0  . rivaroxaban (XARELTO) 20 MG TABS tablet TAKE 1 TABLET DAILY WITH SUPPER 30 tablet 0   No current facility-administered medications on file prior to visit.     Review of Systems:  As per HPI- otherwise negative.   Physical Examination: There were no vitals filed for this visit. There were no vitals filed for this visit. There is no height or weight on file to calculate BMI. Ideal Body Weight:    GEN: WDWN, NAD, Non-toxic, A & O x 3 HEENT: Atraumatic,  Normocephalic. Neck supple. No masses, No LAD. Ears and Nose: No external deformity. CV: RRR, No M/G/R. No JVD. No thrill. No extra heart sounds. PULM: CTA B, no wheezes, crackles, rhonchi. No retractions. No resp. distress. No accessory muscle use. ABD: S, NT, ND, +BS. No rebound. No HSM. EXTR: No c/c/e NEURO Normal gait.  PSYCH: Normally interactive. Conversant. Not depressed or anxious appearing.  Calm demeanor.    Assessment and Plan: ***  Signed Abbe Amsterdam, MD

## 2017-07-14 NOTE — Telephone Encounter (Signed)
Last OV 09/2015- however Pt can't go w/o meds especially Xarelto, and Pt has appt in 2 days- will send 30 day supply only until seen.

## 2017-07-16 ENCOUNTER — Ambulatory Visit: Payer: Medicare Other | Admitting: Family Medicine

## 2017-07-17 NOTE — Progress Notes (Deleted)
[facility] [address], [address] [place], [place] [address] [phone] [phone]  Date:  07/20/2017   Name:  Tanner Gilbert Mccluskey   DOB:  1950/08/09   MRN:  [id number]  PCP:  Pearline Cablesopland, Sharley Keeler C, MD    Chief Complaint: No chief complaint on file.   History of Present Illness:  Tanner Gilbert Shambley is a 67 y.o. very pleasant male patient who presents with the following:  Follow-up visit today History of HTN, CHF, obesity, a fib  Last seen here in July of 2017  Patient Active Problem List   Diagnosis Date Noted  . Persistent atrial fibrillation (HCC)   . Acute on chronic systolic heart failure (HCC)   . Acute on chronic congestive heart failure (HCC)   . CHF (congestive heart failure) (HCC) 09/08/2015  . HTN (hypertension) with goal to be determined 09/08/2012  . Morbid obesity (HCC) 09/08/2012    Past Medical History:  Diagnosis Date  . Atrial fibrillation (HCC)   . CHF (congestive heart failure) (HCC)   . Hypertension   . Obesity     Past Surgical History:  Procedure Laterality Date  . CARDIOVERSION N/A 10/15/2015   Procedure: CARDIOVERSION;  Surgeon: Quintella Reichertraci R Turner, MD;  Location: [facility];  Service: Cardiovascular;  Laterality: N/A;    Social History   Tobacco Use  . Smoking status: Never Smoker  Substance Use Topics  . Alcohol use: No  . Drug use: No    No family history on file.  Allergies  Allergen Reactions  . Apixaban Other (See Comments)    Stiffness of neck and head  . Lisinopril Swelling    Swelling- angioedema    Medication list has been reviewed and updated.  Current Outpatient Medications on File Prior to Visit  Medication Sig Dispense Refill  . BIDIL 20-37.5 MG tablet TAKE 1 TABLET THREE TIMES A DAY 270 tablet 0  . carvedilol (COREG) 25 MG tablet Take 1 tablet (25 mg total) by mouth 2 (two) times daily with a meal. 60 tablet 0  . furosemide (LASIX) 40 MG tablet Take 1 tablet (40 mg total) by mouth  daily. 30 tablet 0  . isosorbide-hydrALAZINE (BIDIL) 20-37.5 MG tablet Take 1 tablet by mouth 3 (three) times daily. 30 tablet 0  . Potassium Chloride ER 20 MEQ TBCR Take 20 mEq by mouth daily. 90 tablet 0  . rivaroxaban (XARELTO) 20 MG TABS tablet TAKE 1 TABLET DAILY WITH SUPPER 30 tablet 0   No current facility-administered medications on file prior to visit.     Review of Systems:  As per HPI- otherwise negative.   Physical Examination: There were no vitals filed for this visit. There were no vitals filed for this visit. There is no height or weight on file to calculate BMI. Ideal Body Weight:    GEN: WDWN, NAD, Non-toxic, A & O x 3 HEENT: Atraumatic, Normocephalic. Neck supple. No masses, No LAD. Ears and Nose: No external deformity. CV: RRR, No M/G/R. No JVD. No thrill. No extra heart sounds. PULM: CTA B, no wheezes, crackles, rhonchi. No retractions. No resp. distress. No accessory muscle use. ABD: S, NT, ND, +BS. No rebound. No HSM. EXTR: No c/c/e NEURO Normal gait.  PSYCH: Normally interactive. Conversant. Not depressed or anxious appearing.  Calm demeanor.    Assessment and Plan: ***  Signed Abbe AmsterdamJessica Radek Carnero, MD

## 2017-07-20 ENCOUNTER — Ambulatory Visit: Payer: Medicare Other | Admitting: Family Medicine

## 2017-12-08 ENCOUNTER — Emergency Department (HOSPITAL_COMMUNITY): Payer: Medicare Other

## 2017-12-08 ENCOUNTER — Inpatient Hospital Stay (HOSPITAL_COMMUNITY)
Admission: EM | Admit: 2017-12-08 | Discharge: 2017-12-16 | DRG: 682 | Disposition: A | Discharge status: Home Health | Payer: Medicare Other | Attending: Internal Medicine | Admitting: Internal Medicine

## 2017-12-08 ENCOUNTER — Other Ambulatory Visit: Payer: Self-pay

## 2017-12-08 ENCOUNTER — Encounter (HOSPITAL_COMMUNITY): Payer: Self-pay | Admitting: Emergency Medicine

## 2017-12-08 DIAGNOSIS — I130 Hypertensive heart and chronic kidney disease with heart failure and stage 1 through stage 4 chronic kidney disease, or unspecified chronic kidney disease: Secondary | ICD-10-CM | POA: Diagnosis present

## 2017-12-08 DIAGNOSIS — Z6829 Body mass index (BMI) 29.0-29.9, adult: Secondary | ICD-10-CM

## 2017-12-08 DIAGNOSIS — I499 Cardiac arrhythmia, unspecified: Secondary | ICD-10-CM | POA: Diagnosis not present

## 2017-12-08 DIAGNOSIS — D631 Anemia in chronic kidney disease: Secondary | ICD-10-CM | POA: Diagnosis present

## 2017-12-08 DIAGNOSIS — S301XXA Contusion of abdominal wall, initial encounter: Secondary | ICD-10-CM | POA: Diagnosis present

## 2017-12-08 DIAGNOSIS — R7989 Other specified abnormal findings of blood chemistry: Secondary | ICD-10-CM | POA: Diagnosis present

## 2017-12-08 DIAGNOSIS — I10 Essential (primary) hypertension: Secondary | ICD-10-CM | POA: Diagnosis present

## 2017-12-08 DIAGNOSIS — R402363 Coma scale, best motor response, obeys commands, at hospital admission: Secondary | ICD-10-CM | POA: Diagnosis present

## 2017-12-08 DIAGNOSIS — E876 Hypokalemia: Secondary | ICD-10-CM | POA: Diagnosis not present

## 2017-12-08 DIAGNOSIS — K760 Fatty (change of) liver, not elsewhere classified: Secondary | ICD-10-CM | POA: Diagnosis present

## 2017-12-08 DIAGNOSIS — E872 Acidosis: Secondary | ICD-10-CM | POA: Diagnosis not present

## 2017-12-08 DIAGNOSIS — Z7901 Long term (current) use of anticoagulants: Secondary | ICD-10-CM

## 2017-12-08 DIAGNOSIS — R0902 Hypoxemia: Secondary | ICD-10-CM | POA: Diagnosis not present

## 2017-12-08 DIAGNOSIS — R791 Abnormal coagulation profile: Secondary | ICD-10-CM | POA: Diagnosis present

## 2017-12-08 DIAGNOSIS — R112 Nausea with vomiting, unspecified: Secondary | ICD-10-CM | POA: Diagnosis not present

## 2017-12-08 DIAGNOSIS — E538 Deficiency of other specified B group vitamins: Secondary | ICD-10-CM | POA: Diagnosis present

## 2017-12-08 DIAGNOSIS — I481 Persistent atrial fibrillation: Secondary | ICD-10-CM | POA: Diagnosis present

## 2017-12-08 DIAGNOSIS — X58XXXA Exposure to other specified factors, initial encounter: Secondary | ICD-10-CM | POA: Diagnosis present

## 2017-12-08 DIAGNOSIS — E46 Unspecified protein-calorie malnutrition: Secondary | ICD-10-CM | POA: Diagnosis present

## 2017-12-08 DIAGNOSIS — R0689 Other abnormalities of breathing: Secondary | ICD-10-CM | POA: Diagnosis not present

## 2017-12-08 DIAGNOSIS — R0602 Shortness of breath: Secondary | ICD-10-CM | POA: Diagnosis not present

## 2017-12-08 DIAGNOSIS — E860 Dehydration: Secondary | ICD-10-CM | POA: Diagnosis present

## 2017-12-08 DIAGNOSIS — R262 Difficulty in walking, not elsewhere classified: Secondary | ICD-10-CM | POA: Diagnosis present

## 2017-12-08 DIAGNOSIS — I444 Left anterior fascicular block: Secondary | ICD-10-CM | POA: Diagnosis present

## 2017-12-08 DIAGNOSIS — N179 Acute kidney failure, unspecified: Secondary | ICD-10-CM | POA: Diagnosis not present

## 2017-12-08 DIAGNOSIS — R404 Transient alteration of awareness: Secondary | ICD-10-CM | POA: Diagnosis not present

## 2017-12-08 DIAGNOSIS — E870 Hyperosmolality and hypernatremia: Secondary | ICD-10-CM | POA: Diagnosis not present

## 2017-12-08 DIAGNOSIS — R6510 Systemic inflammatory response syndrome (SIRS) of non-infectious origin without acute organ dysfunction: Secondary | ICD-10-CM

## 2017-12-08 DIAGNOSIS — I959 Hypotension, unspecified: Secondary | ICD-10-CM | POA: Diagnosis not present

## 2017-12-08 DIAGNOSIS — R570 Cardiogenic shock: Secondary | ICD-10-CM | POA: Diagnosis present

## 2017-12-08 DIAGNOSIS — I5023 Acute on chronic systolic (congestive) heart failure: Secondary | ICD-10-CM | POA: Diagnosis not present

## 2017-12-08 DIAGNOSIS — N170 Acute kidney failure with tubular necrosis: Secondary | ICD-10-CM | POA: Diagnosis not present

## 2017-12-08 DIAGNOSIS — R933 Abnormal findings on diagnostic imaging of other parts of digestive tract: Secondary | ICD-10-CM | POA: Diagnosis not present

## 2017-12-08 DIAGNOSIS — E861 Hypovolemia: Secondary | ICD-10-CM | POA: Diagnosis present

## 2017-12-08 DIAGNOSIS — J9601 Acute respiratory failure with hypoxia: Secondary | ICD-10-CM | POA: Diagnosis present

## 2017-12-08 DIAGNOSIS — I493 Ventricular premature depolarization: Secondary | ICD-10-CM | POA: Diagnosis present

## 2017-12-08 DIAGNOSIS — T502X5A Adverse effect of carbonic-anhydrase inhibitors, benzothiadiazides and other diuretics, initial encounter: Secondary | ICD-10-CM | POA: Diagnosis present

## 2017-12-08 DIAGNOSIS — I351 Nonrheumatic aortic (valve) insufficiency: Secondary | ICD-10-CM | POA: Diagnosis not present

## 2017-12-08 DIAGNOSIS — R579 Shock, unspecified: Secondary | ICD-10-CM | POA: Diagnosis not present

## 2017-12-08 DIAGNOSIS — M545 Low back pain: Secondary | ICD-10-CM | POA: Diagnosis present

## 2017-12-08 DIAGNOSIS — R000 Tachycardia, unspecified: Secondary | ICD-10-CM | POA: Diagnosis not present

## 2017-12-08 DIAGNOSIS — R6511 Systemic inflammatory response syndrome (SIRS) of non-infectious origin with acute organ dysfunction: Secondary | ICD-10-CM | POA: Diagnosis present

## 2017-12-08 DIAGNOSIS — N189 Chronic kidney disease, unspecified: Secondary | ICD-10-CM | POA: Diagnosis present

## 2017-12-08 DIAGNOSIS — I952 Hypotension due to drugs: Secondary | ICD-10-CM | POA: Diagnosis present

## 2017-12-08 DIAGNOSIS — R402253 Coma scale, best verbal response, oriented, at hospital admission: Secondary | ICD-10-CM | POA: Diagnosis present

## 2017-12-08 DIAGNOSIS — K429 Umbilical hernia without obstruction or gangrene: Secondary | ICD-10-CM | POA: Diagnosis not present

## 2017-12-08 DIAGNOSIS — Z7401 Bed confinement status: Secondary | ICD-10-CM | POA: Diagnosis not present

## 2017-12-08 DIAGNOSIS — Z888 Allergy status to other drugs, medicaments and biological substances status: Secondary | ICD-10-CM

## 2017-12-08 DIAGNOSIS — K567 Ileus, unspecified: Secondary | ICD-10-CM | POA: Diagnosis present

## 2017-12-08 DIAGNOSIS — I4819 Other persistent atrial fibrillation: Secondary | ICD-10-CM | POA: Diagnosis present

## 2017-12-08 DIAGNOSIS — J9811 Atelectasis: Secondary | ICD-10-CM | POA: Diagnosis present

## 2017-12-08 DIAGNOSIS — R1111 Vomiting without nausea: Secondary | ICD-10-CM | POA: Diagnosis not present

## 2017-12-08 DIAGNOSIS — Z79899 Other long term (current) drug therapy: Secondary | ICD-10-CM

## 2017-12-08 DIAGNOSIS — R162 Hepatomegaly with splenomegaly, not elsewhere classified: Secondary | ICD-10-CM | POA: Diagnosis not present

## 2017-12-08 DIAGNOSIS — R402143 Coma scale, eyes open, spontaneous, at hospital admission: Secondary | ICD-10-CM | POA: Diagnosis present

## 2017-12-08 DIAGNOSIS — M2550 Pain in unspecified joint: Secondary | ICD-10-CM | POA: Diagnosis not present

## 2017-12-08 DIAGNOSIS — R5381 Other malaise: Secondary | ICD-10-CM | POA: Diagnosis present

## 2017-12-08 LAB — CBC
HCT: 37.3 % — ABNORMAL LOW (ref 39.0–52.0)
HEMOGLOBIN: 12.4 g/dL — AB (ref 13.0–17.0)
MCH: 31.3 pg (ref 26.0–34.0)
MCHC: 33.2 g/dL (ref 30.0–36.0)
MCV: 94.2 fL (ref 78.0–100.0)
Platelets: 368 10*3/uL (ref 150–400)
RBC: 3.96 MIL/uL — ABNORMAL LOW (ref 4.22–5.81)
RDW: 15.2 % (ref 11.5–15.5)
WBC: 14.7 10*3/uL — ABNORMAL HIGH (ref 4.0–10.5)

## 2017-12-08 LAB — BLOOD GAS, ARTERIAL
ACID-BASE DEFICIT: 9.8 mmol/L — AB (ref 0.0–2.0)
Bicarbonate: 13.6 mmol/L — ABNORMAL LOW (ref 20.0–28.0)
DRAWN BY: 441661
Delivery systems: POSITIVE
EXPIRATORY PAP: 5
FIO2: 40
INSPIRATORY PAP: 10
LHR: 10 {breaths}/min
O2 SAT: 99.3 %
PCO2 ART: 20 mmHg — AB (ref 32.0–48.0)
PH ART: 7.445 (ref 7.350–7.450)
Patient temperature: 98.6
pO2, Arterial: 185 mmHg — ABNORMAL HIGH (ref 83.0–108.0)

## 2017-12-08 LAB — I-STAT TROPONIN, ED: Troponin i, poc: 0.05 ng/mL (ref 0.00–0.08)

## 2017-12-08 LAB — BASIC METABOLIC PANEL
ANION GAP: 23 — AB (ref 5–15)
BUN: 35 mg/dL — AB (ref 8–23)
CHLORIDE: 102 mmol/L (ref 98–111)
CO2: 10 mmol/L — AB (ref 22–32)
Calcium: 8.9 mg/dL (ref 8.9–10.3)
Creatinine, Ser: 3.44 mg/dL — ABNORMAL HIGH (ref 0.61–1.24)
GFR calc Af Amer: 20 mL/min — ABNORMAL LOW (ref >60)
GFR calc non Af Amer: 17 mL/min — ABNORMAL LOW (ref >60)
Glucose, Bld: 121 mg/dL — ABNORMAL HIGH (ref 70–99)
Potassium: 3.7 mmol/L (ref 3.5–5.1)
Sodium: 135 mmol/L (ref 135–145)

## 2017-12-08 LAB — D-DIMER, QUANTITATIVE: D-Dimer, Quant: 1.39 ug/mL-FEU — ABNORMAL HIGH (ref 0.00–0.50)

## 2017-12-08 LAB — APTT: aPTT: 37 seconds — ABNORMAL HIGH (ref 24–36)

## 2017-12-08 LAB — BRAIN NATRIURETIC PEPTIDE: B Natriuretic Peptide: 976 pg/mL — ABNORMAL HIGH (ref 0.0–100.0)

## 2017-12-08 MED ORDER — ACETAMINOPHEN 650 MG RE SUPP: 650.0000 mg | Freq: Four times a day (QID) | RECTAL | Status: DC | PRN

## 2017-12-08 MED ORDER — FUROSEMIDE 10 MG/ML IJ SOLN
40.0000 mg | Freq: Once | INTRAMUSCULAR | Status: AC
  Administered 2017-12-08: 40 mg via INTRAVENOUS
  Filled 2017-12-08: qty 4

## 2017-12-08 MED ORDER — ONDANSETRON HCL 4 MG/2ML IJ SOLN: 4.0000 mg | Freq: Four times a day (QID) | INTRAMUSCULAR | Status: DC | PRN

## 2017-12-08 MED ORDER — ONDANSETRON HCL 4 MG PO TABS
4.0000 mg | ORAL_TABLET | Freq: Four times a day (QID) | ORAL | Status: DC | PRN
  Administered 2017-12-12: 4 mg via ORAL
  Filled 2017-12-08: qty 1

## 2017-12-08 MED ORDER — HEPARIN (PORCINE) IN NACL 100-0.45 UNIT/ML-% IJ SOLN
1150.0000 [IU]/h | INTRAMUSCULAR | Status: DC
  Administered 2017-12-09: 1250 [IU]/h via INTRAVENOUS
  Filled 2017-12-08: qty 250

## 2017-12-08 MED ORDER — FUROSEMIDE 10 MG/ML IJ SOLN: 40.0000 mg | Freq: Two times a day (BID) | INTRAMUSCULAR | Status: DC

## 2017-12-08 MED ORDER — ISOSORB DINITRATE-HYDRALAZINE 20-37.5 MG PO TABS
1.0000 | ORAL_TABLET | Freq: Three times a day (TID) | ORAL | Status: DC
  Administered 2017-12-09: 1 via ORAL
  Filled 2017-12-08 (×2): qty 1

## 2017-12-08 MED ORDER — ACETAMINOPHEN 325 MG PO TABS
650.0000 mg | ORAL_TABLET | Freq: Four times a day (QID) | ORAL | Status: DC | PRN
  Filled 2017-12-08: qty 2

## 2017-12-08 MED ORDER — DILTIAZEM HCL 25 MG/5ML IV SOLN
15.0000 mg | Freq: Once | INTRAVENOUS | Status: AC
  Administered 2017-12-08: 15 mg via INTRAVENOUS
  Filled 2017-12-08: qty 5

## 2017-12-08 MED ORDER — DILTIAZEM HCL 25 MG/5ML IV SOLN
10.0000 mg | Freq: Once | INTRAVENOUS | Status: AC
  Administered 2017-12-08: 10 mg via INTRAVENOUS
  Filled 2017-12-08: qty 5

## 2017-12-08 MED ORDER — CARVEDILOL 12.5 MG PO TABS
25.0000 mg | ORAL_TABLET | Freq: Two times a day (BID) | ORAL | Status: DC
  Administered 2017-12-09: 25 mg via ORAL
  Filled 2017-12-08 (×2): qty 2

## 2017-12-08 NOTE — Progress Notes (Signed)
RT attempted to collect ABG, but was unsuccessful. Different RT will attempt blood draw.

## 2017-12-08 NOTE — Progress Notes (Addendum)
ANTICOAGULATION CONSULT NOTE - follow  up  Pharmacy Consult for Heparin  Indication: atrial fibrillation  Allergies  Allergen Reactions  . Apixaban Other (See Comments)    Stiffness of neck and head  . Lisinopril Swelling    Swelling- angioedema    Patient Measurements: Height: 6' (182.9 cm) Weight: 200 lb 9.9 oz (91 kg) IBW/kg (Calculated) : 77.6 Heparin Dosing Weight: 91 kg  Vital Signs: Temp: 97.7 F (36.5 C) (09/10 1916) Temp Source: Oral (09/10 1916) BP: 102/82 (09/10 2220) Pulse Rate: 90 (09/10 2220)  Labs: Recent Labs    12/08/17 1913  HGB 12.4*  HCT 37.3*  PLT 368  CREATININE 3.44*    Estimated Creatinine Clearance: 22.6 mL/min (A) (by C-G formula based on SCr of 3.44 mg/dL (H)).   Medical History: Past Medical History:  Diagnosis Date  . Atrial fibrillation (HCC)   . CHF (congestive heart failure) (HCC)   . Hypertension   . Obesity     Medications:  Medications Prior to Admission  Medication Sig Dispense Refill Last Dose  . carvedilol (COREG) 25 MG tablet Take 1 tablet (25 mg total) by mouth 2 (two) times daily with a meal. 60 tablet 0 12/08/2017 at Unknown time  . furosemide (LASIX) 40 MG tablet Take 1 tablet (40 mg total) by mouth daily. 30 tablet 0 12/07/2017 at Unknown time  . isosorbide-hydrALAZINE (BIDIL) 20-37.5 MG tablet Take 1 tablet by mouth 3 (three) times daily. 30 tablet 0 12/07/2017 at Unknown time  . Potassium Chloride ER 20 MEQ TBCR Take 20 mEq by mouth daily. 90 tablet 0 12/07/2017 at Unknown time  . rivaroxaban (XARELTO) 20 MG TABS tablet TAKE 1 TABLET DAILY WITH SUPPER 30 tablet 0 12/07/2017  . BIDIL 20-37.5 MG tablet TAKE 1 TABLET THREE TIMES A DAY (Patient not taking: Reported on 12/08/2017) 270 tablet 0 Not Taking at Unknown time   Scheduled:  . carvedilol  25 mg Oral BID WC  . [START ON 12/09/2017] furosemide  40 mg Intravenous Q12H  . isosorbide-hydrALAZINE  1 tablet Oral TID    Assessment: 67 y.o male presented to [facility] ED 12/08/17  with increased shortness of breath since 1830, with generalized weakness and  in Afib RVR 150s.  He takes Xarelto 20mg  daily prior to admission for h/o AFib.  last taken on 12/07/17.  Pharmacy consulted to start IV heparin drip.  Baseline aPTT 37,  HL <0.1, thus okay to use heparin levels to monitor heparin.   6 hour heparin level is 0.37 on heparin 1250 units/hr.  RN notes that patient coughed up dark emesis which he said was "tobacco juice".  Also noted 125cc dark tea colored urine.   Goal of Therapy:  Heparin level 0.3-0.7 units/ml Monitor platelets by anticoagulation protocol: Yes   Plan:  Continue IV Heparin infusiont 1250 units/hr APTT 6 hours to confirm therapeutic. Daily PTT , heparin level and CBC.    Tanner Gilbert, RPh Clinical Pharmacist Please check AMION for all [facility]acy phone numbers After 10:00 PM, call Main Pharmacy [phone] 12/08/2017,11:29 PM

## 2017-12-08 NOTE — ED Triage Notes (Addendum)
Patient with increased shortness of breath since 1830, with generalized weakness.  Patient was very tachypneic upon EMS arrival to 30-40.  Patient is in Afib RVR 150s.  He is also complaining of lower back pain.  Patient has been alert and oriented with EMS.  Patient given 125mg  solumedrol.

## 2017-12-08 NOTE — ED Provider Notes (Signed)
I saw and evaluated the patient, reviewed the resident's note and I agree with the findings and plan.  EKG: EKG Interpretation  Date/Time:  Tuesday December 08 2017 19:10:08 EDT Ventricular Rate:  138 PR Interval:    QRS Duration: 118 QT Interval:  319 QTC Calculation: 484 R Axis:   -54 Text Interpretation:  Atrial fibrillation Ventricular premature complex LAD, consider left anterior fascicular block Abnrm T, consider ischemia, anterolateral lds Artifact in lead(s) I III aVR aVL Confirmed by Lorre Nick ([id number]) on 12/08/2017 7:65:73 PM  67 year old male presents with acute shortness of breath while at home.  EMS called patient found to be hypoxic and placed on CPAP.  Concern for possible flash pulmonary edema.  Patient is in A. fib with RVR.  Will treat for CHF as well as A. fib and likely admit to the hospital   Lorre Nick, MD 12/08/17 1918

## 2017-12-08 NOTE — ED Provider Notes (Signed)
[facility] EMERGENCY DEPARTMENT Provider Note   CSN: [id number] Arrival date & time: 12/08/17  1902     History   Chief Complaint Chief Complaint  Patient presents with  . Shortness of Breath    HPI Tanner Gilbert is a 67 y.o. male.  The history is provided by the patient.  Shortness of Breath  This is a new problem. The current episode started less than 1 hour ago. The problem has not changed since onset.Pertinent negatives include no rhinorrhea, no cough, no chest pain, no vomiting, no abdominal pain and no leg swelling. It is unknown what precipitated the problem. Risk factors: CHF. Treatments tried: CPAP. The treatment provided moderate relief. Associated medical issues include heart failure. Associated medical issues do not include asthma, COPD or past MI.    Past Medical History:  Diagnosis Date  . Atrial fibrillation (HCC)   . CHF (congestive heart failure) (HCC)   . Hypertension   . Obesity     Patient Active Problem List   Diagnosis Date Noted  . Nausea and vomiting   . Hypotension   . SIRS (systemic inflammatory response syndrome) (HCC)   . Acute respiratory failure with hypoxia (HCC) 12/08/2017  . ARF (acute renal failure) (HCC) 12/08/2017  . Persistent atrial fibrillation (HCC)   . Acute on chronic systolic heart failure (HCC)   . Acute on chronic congestive heart failure (HCC)   . CHF (congestive heart failure) (HCC) 09/08/2015  . HTN (hypertension) with goal to be determined 09/08/2012  . Morbid obesity (HCC) 09/08/2012    Past Surgical History:  Procedure Laterality Date  . CARDIOVERSION N/A 10/15/2015   Procedure: CARDIOVERSION;  Surgeon: Quintella Reichert, MD;  Location: [facility];  Service: Cardiovascular;  Laterality: N/A;        Home Medications    Prior to Admission medications   Medication Sig Start Date End Date Taking? Authorizing Provider  carvedilol (COREG) 25 MG tablet Take 1 tablet (25 mg total) by mouth 2 (two)  times daily with a meal. 07/14/17  Yes Copland, Gwenlyn Found, MD  furosemide (LASIX) 40 MG tablet Take 1 tablet (40 mg total) by mouth daily. 07/14/17  Yes Copland, Gwenlyn Found, MD  isosorbide-hydrALAZINE (BIDIL) 20-37.5 MG tablet Take 1 tablet by mouth 3 (three) times daily. 05/07/16  Yes Copland, Gwenlyn Found, MD  Potassium Chloride ER 20 MEQ TBCR Take 20 mEq by mouth daily. 12/19/16  Yes Copland, Gwenlyn Found, MD  rivaroxaban (XARELTO) 20 MG TABS tablet TAKE 1 TABLET DAILY WITH SUPPER 07/14/17  Yes Copland, Gwenlyn Found, MD  BIDIL 20-37.5 MG tablet TAKE 1 TABLET THREE TIMES A DAY Patient not taking: Reported on 12/08/2017 12/12/16   Copland, Gwenlyn Found, MD    Family History Family History  Family history unknown: Yes    Social History Social History   Tobacco Use  . Smoking status: Never Smoker  . Smokeless tobacco: Never Used  Substance Use Topics  . Alcohol use: No  . Drug use: No     Allergies   Apixaban and Lisinopril   Review of Systems Review of Systems  Constitutional: Negative for unexpected weight change.  HENT: Negative for congestion and rhinorrhea.   Respiratory: Positive for shortness of breath. Negative for cough.   Cardiovascular: Negative for chest pain and leg swelling.  Gastrointestinal: Negative for abdominal pain, nausea and vomiting.  Genitourinary: Negative for dysuria.  Musculoskeletal: Negative for back pain.  Skin: Negative for wound.     Physical Exam  Updated Vital Signs BP (!) 79/61 (BP Location: Right Arm)   Pulse 74   Temp 98.1 F (36.7 C) (Oral)   Resp (!) 29   Ht 6' (1.829 m)   Wt 91 kg   SpO2 98%   BMI 27.21 kg/m   Physical Exam  Constitutional: He appears well-developed and well-nourished. He appears ill.  HENT:  Head: Normocephalic and atraumatic.  Eyes: Conjunctivae are normal.  Neck: Neck supple.  Cardiovascular: Normal rate and regular rhythm.  No murmur heard. Pulmonary/Chest: Breath sounds normal. Accessory muscle usage present.  Tachypnea noted. He is in respiratory distress. He has no decreased breath sounds. He has no wheezes.  Abdominal: Soft. There is no tenderness.  Musculoskeletal: He exhibits no edema.  Neurological: He is alert.  Skin: Skin is warm and dry.  Psychiatric: He has a normal mood and affect.  Nursing note and vitals reviewed.    ED Treatments / Results  Labs (all labs ordered are listed, but only abnormal results are displayed) Labs Reviewed  BASIC METABOLIC PANEL - Abnormal; Notable for the following components:      Result Value   CO2 10 (*)    Glucose, Bld 121 (*)    BUN 35 (*)    Creatinine, Ser 3.44 (*)    GFR calc non Af Amer 17 (*)    GFR calc Af Amer 20 (*)    Anion gap 23 (*)    All other components within normal limits  CBC - Abnormal; Notable for the following components:   WBC 14.7 (*)    RBC 3.96 (*)    Hemoglobin 12.4 (*)    HCT 37.3 (*)    All other components within normal limits  BRAIN NATRIURETIC PEPTIDE - Abnormal; Notable for the following components:   B Natriuretic Peptide 976.0 (*)    All other components within normal limits  D-DIMER, QUANTITATIVE (NOT AT [facility]) - Abnormal; Notable for the following components:   D-Dimer, Quant 1.39 (*)    All other components within normal limits  BASIC METABOLIC PANEL - Abnormal; Notable for the following components:   CO2 15 (*)    Glucose, Bld 135 (*)    BUN 43 (*)    Creatinine, Ser 3.98 (*)    Calcium 8.3 (*)    GFR calc non Af Amer 14 (*)    GFR calc Af Amer 16 (*)    Anion gap 19 (*)    All other components within normal limits  CBC - Abnormal; Notable for the following components:   RBC 3.32 (*)    Hemoglobin 10.2 (*)    HCT 30.4 (*)    All other components within normal limits  TROPONIN I - Abnormal; Notable for the following components:   Troponin I 0.05 (*)    All other components within normal limits  TROPONIN I - Abnormal; Notable for the following components:   Troponin I 0.05 (*)    All other  components within normal limits  TROPONIN I - Abnormal; Notable for the following components:   Troponin I 0.04 (*)    All other components within normal limits  HEPATIC FUNCTION PANEL - Abnormal; Notable for the following components:   Albumin 2.1 (*)    AST 111 (*)    Total Bilirubin 4.9 (*)    Bilirubin, Direct 2.2 (*)    Indirect Bilirubin 2.7 (*)    All other components within normal limits  BLOOD GAS, ARTERIAL - Abnormal; Notable for the following  components:   pCO2 arterial 20.0 (*)    pO2, Arterial 185 (*)    Bicarbonate 13.6 (*)    Acid-base deficit 9.8 (*)    All other components within normal limits  URINALYSIS, ROUTINE W REFLEX MICROSCOPIC - Abnormal; Notable for the following components:   Color, Urine AMBER (*)    Bilirubin Urine SMALL (*)    All other components within normal limits  HEPARIN LEVEL (UNFRACTIONATED) - Abnormal; Notable for the following components:   Heparin Unfractionated <0.10 (*)    All other components within normal limits  APTT - Abnormal; Notable for the following components:   aPTT 37 (*)    All other components within normal limits  APTT - Abnormal; Notable for the following components:   aPTT >200 (*)    All other components within normal limits  CBC - Abnormal; Notable for the following components:   RBC 3.24 (*)    Hemoglobin 10.1 (*)    HCT 30.1 (*)    All other components within normal limits  MRSA PCR SCREENING  HIV ANTIBODY (ROUTINE TESTING)  TSH  MAGNESIUM  CREATININE, URINE, RANDOM  SODIUM, URINE, RANDOM  HEPARIN LEVEL (UNFRACTIONATED)  HEPARIN LEVEL (UNFRACTIONATED)  CORTISOL  PROCALCITONIN  CBC  BASIC METABOLIC PANEL  PROCALCITONIN  HEPATIC FUNCTION PANEL  I-STAT TROPONIN, ED  TYPE AND SCREEN    EKG EKG Interpretation  Date/Time:  Tuesday December 08 2017 19:10:08 EDT Ventricular Rate:  138 PR Interval:    QRS Duration: 118 QT Interval:  319 QTC Calculation: 484 R Axis:   -54 Text Interpretation:  Atrial  fibrillation Ventricular premature complex LAD, consider left anterior fascicular block Abnrm T, consider ischemia, anterolateral lds Artifact in lead(s) I III aVR aVL Confirmed by Lorre Nick ([id number]) on 12/08/2017 7:16:59 PM   Radiology Nm Pulmonary Perf And Vent  Result Date: 12/09/2017 CLINICAL DATA:  Shortness of breath EXAM: NUCLEAR MEDICINE VENTILATION - PERFUSION LUNG SCAN VIEWS: Anterior, posterior, LPO, RPO, LAO, RAO-ventilation and perfusion RADIOPHARMACEUTICALS:  31.2 mCi of Tc-55m DTPA aerosol inhalation and 4.2 mCi Tc25m-MAA IV COMPARISON:  Chest radiograph December 08, 2017 FINDINGS: Ventilation: Radiotracer uptake on the ventilation study is homogeneous and symmetric bilaterally. No ventilation defects are evident. There is elevation of the right hemidiaphragm, noted on chest radiograph. Perfusion: Radiotracer uptake is homogeneous and symmetric bilaterally. No perfusion defects are evident. There is elevation of the right hemidiaphragm. IMPRESSION: No appreciable ventilation or perfusion defect. This study constitutes a very low probability of pulmonary embolus. There is elevation of the right hemidiaphragm. Electronically Signed   By: Bretta Bang III M.D.   On: 12/09/2017 10:05   Dg Chest Portable 1 View  Result Date: 12/08/2017 CLINICAL DATA:  Short of breath and weakness since 630 today EXAM: PORTABLE CHEST 1 VIEW COMPARISON:  09/08/2015 FINDINGS: Mild cardiomegaly. Normal vascularity. Right hemidiaphragm remains elevated. Bibasilar atelectasis. IMPRESSION: Cardiomegaly without decompensation. Bibasilar atelectasis. Electronically Signed   By: Jolaine Click M.D.   On: 12/08/2017 19:43   Dg Abd Portable 1v  Result Date: 12/09/2017 CLINICAL DATA:  Nausea and vomiting EXAM: PORTABLE ABDOMEN - 1 VIEW COMPARISON:  Abdominal ultrasound of December 09, 2017 FINDINGS: The stomach is moderately distended with air. No small or large bowel obstruction is observed. The stool burden does  not appear excessive. There are no abnormal soft tissue calcifications. There are degenerative changes at the L3-4 and L4-5 disc levels. IMPRESSION: Moderate gaseous distention of the stomach may reflect gastric outlet obstruction. No evidence of a  small or large bowel obstructive process. Electronically Signed   By: David  [place] M.D.   On: 12/09/2017 15:17   US Abdomen Limited Ruq  Result Date: 12/09/2017 CLINICAL DATA:  Hyperbilirubinemia EXAM: ULTRASOUND ABDOMEN LIMITED RIGHT UPPER QUADRANT COMPARISON:  None. FINDINGS: Gallbladder: Poor visualization of the gallbladder which appears to be contracted and mildly thick-walled at 3 mm. Orientation of the gallbladder and patient body habitus made assessment problematic. Small nonshadowing filling defects in the gallbladder for example on image 5 potentially from tumefactive sludge. This was not interrogated by Doppler to assess for solid mass. Strictly speaking a gallbladder polyp could have a similar appearance for example on image 8. Sonographic Eulah Pont sign was absent.  No definite shadowing calculi. Common bile duct: Diameter: 4 mm Liver: Coarse echogenic liver with poor sonic penetration compatible with diffuse hepatic steatosis. Portal vein is patent on color Doppler imaging with normal direction of blood flow towards the liver. IMPRESSION: 1. Technically difficult exam due to orientation of the gallbladder and the patient's body habitus. Also the gallbladder appears somewhat contracted and mildly thick-walled, although sonographic Murphy sign was absent. There is some complex rounded filling defect in the gallbladder which is probably tumefactive sludge, along with several small nonshadowing intraluminal hyperechogenicies. The possibility of a polyp or small gallbladder mass is not excluded on today's exam. Given the limited liver enzymes and constellation of findings, consider MRI/MRCP with and without contrast for further characterization. 2. Coarse  echogenic liver with poor sonic penetration compatible with diffuse hepatic steatosis. Electronically Signed   By: Gaylyn Rong M.D.   On: 12/09/2017 12:50    Procedures Procedures (including critical care time)  Medications Ordered in ED Medications  acetaminophen (TYLENOL) tablet 650 mg (has no administration in time range)    Or  acetaminophen (TYLENOL) suppository 650 mg (has no administration in time range)  ondansetron (ZOFRAN) tablet 4 mg (has no administration in time range)    Or  ondansetron (ZOFRAN) injection 4 mg (has no administration in time range)  0.9 %  sodium chloride infusion ( Intravenous Rate/Dose Change 12/09/17 1904)  piperacillin-tazobactam (ZOSYN) IVPB 2.25 g (2.25 g Intravenous New Bag/Given 12/09/17 1905)  white petrolatum (VASELINE) gel (has no administration in time range)  iopamidol (ISOVUE-300) 61 % injection (has no administration in time range)  furosemide (LASIX) injection 40 mg (40 mg Intravenous Given 12/08/17 1930)  diltiazem (CARDIZEM) injection 15 mg (15 mg Intravenous Given 12/08/17 1933)  diltiazem (CARDIZEM) injection 10 mg (10 mg Intravenous Given 12/08/17 2211)  technetium TC 20M diethylenetriame-pentaacetic acid (DTPA) injection 30 millicurie (30 millicuries Inhalation Given 12/09/17 0815)  technetium albumin aggregated (MAA) injection solution 4 millicurie (4 millicuries Intravenous Contrast Given 12/09/17 0904)  sodium chloride 0.9 % bolus 500 mL (500 mLs Intravenous New Bag/Given 12/09/17 1200)  digoxin (LANOXIN) 0.25 MG/ML injection 0.125 mg (0.125 mg Intravenous Given 12/09/17 1312)     Initial Impression / Assessment and Plan / ED Course  I have reviewed the triage vital signs and the nursing notes.  Pertinent labs & imaging results that were available during my care of the patient were reviewed by me and considered in my medical decision making (see chart for details).     The patient is a 67 year old male who has had no history of  atrial fibrillation his heart failure who presents with shortness of breath.  The patient became acutely short of breath approximately an hour ago and called EMS.  On EMS arrival, the patient was tachycardic and hypoxic  to  the 70s on room air.  The patient was placed on CPAP and was given Solu-Medrol.  The patient reports that his breathing has improved on CPAP.  The patient was transitioned to BiPAP on arrival in the emergency department.  Patient still has increased work of breathing, however no wheezing or crackles heard on his exam.  Patient found to be in A. fib with RVR and was given 15 mg of diltiazem.  Patient also given 40 mg of IV Lasix due to concern for CHF exacerbation.  BNP elevated, troponin wnl, CO2 10, and patient has leukocytosis, but no signs of PNA on CXR. Patient has an elevated d-dimer, however he also has a GFR of 20, so the patient is not able to get a CTA of his chest.  Patient still requiring BiPAP and unable to transition from his in the emergency department.  Patient will be admitted to [facility] for further CHF exacerbation versus pulmonary embolism.  Patient may refuse again and patient.  Patient care supervised patient care supervised by Dr. Freida Busman.  Nash Dimmer, MD  Final Clinical Impressions(s) / ED Diagnoses   Final diagnoses:  Hyperbilirubinemia  Nausea and vomiting    ED Discharge Orders    None       Nash Dimmer, MD 12/09/17 2038    Lorre Nick, MD 12/10/17 1330

## 2017-12-08 NOTE — H&P (Addendum)
History and Physical    Tanner Gilbert IHW:[id number] DOB: 1951/02/22 DOA: 12/08/2017  PCP: Pearline Cables, MD  Patient coming from: Home.  Chief Complaint: Shortness of breath.  HPI: Tanner Gilbert is a 67 y.o. male with history of atrial fibrillation, chronic systolic heart failure last EF measured in 2017 was 40 to 45%, hypertension was brought to the ER because of shortness of breath.  Patient son states that patient is finding it difficult to walk since this afternoon.  Mainly because of low back pain.  Due to persistent difficulty EMS was called and patient was found to be short of breath and was brought to the ER.  Patient states he also was feeling very warm and hot.  Denies any chest pain productive cough nausea vomiting abdominal pain fever or chills.  Patient states he finds it difficult to move his lower extremity because of low back pain.  Has not had any fall.  ED Course: In the ER patient was hypoxic and was brought on CPAP.  His chest x-ray does not show anything acute.  BNP was around 976 d-dimer was mildly elevated at 1.3.  EKG was showing A. fib with mildly elevated heart rate for which Cardizem bolus was given.  Patient was given Lasix 40 mg IV and admitted for acute respiratory failure likely from CHF.  On exam patient is able to move all extremities but lower extremity movement is restricted by low back pain.  Notably patient's creatinine is markedly elevated and last creatinine in our system in 2017 was 1.4.  Patient states he did have follow-up with nephrologist but was told his kidney functions were stable.  Review of Systems: As per HPI, rest all negative.   Past Medical History:  Diagnosis Date  . Atrial fibrillation (HCC)   . CHF (congestive heart failure) (HCC)   . Hypertension   . Obesity     Past Surgical History:  Procedure Laterality Date  . CARDIOVERSION N/A 10/15/2015   Procedure: CARDIOVERSION;  Surgeon: Quintella Reichert, MD;  Location: [facility];   Service: Cardiovascular;  Laterality: N/A;     reports that he has never smoked. He has never used smokeless tobacco. He reports that he does not drink alcohol or use drugs.  Allergies  Allergen Reactions  . Apixaban Other (See Comments)    Stiffness of neck and head  . Lisinopril Swelling    Swelling- angioedema    Family History  Family history unknown: Yes    Prior to Admission medications   Medication Sig Start Date End Date Taking? Authorizing Provider  carvedilol (COREG) 25 MG tablet Take 1 tablet (25 mg total) by mouth 2 (two) times daily with a meal. 07/14/17  Yes Copland, Gwenlyn Found, MD  furosemide (LASIX) 40 MG tablet Take 1 tablet (40 mg total) by mouth daily. 07/14/17  Yes Copland, Gwenlyn Found, MD  isosorbide-hydrALAZINE (BIDIL) 20-37.5 MG tablet Take 1 tablet by mouth 3 (three) times daily. 05/07/16  Yes Copland, Gwenlyn Found, MD  Potassium Chloride ER 20 MEQ TBCR Take 20 mEq by mouth daily. 12/19/16  Yes Copland, Gwenlyn Found, MD  rivaroxaban (XARELTO) 20 MG TABS tablet TAKE 1 TABLET DAILY WITH SUPPER 07/14/17  Yes Copland, Gwenlyn Found, MD  BIDIL 20-37.5 MG tablet TAKE 1 TABLET THREE TIMES A DAY Patient not taking: Reported on 12/08/2017 12/12/16   Pearline Cables, MD    Physical Exam: Vitals:   12/08/17 2030 12/08/17 2100 12/08/17 2115 12/08/17 2220  BP: (!) 115/91 Marland Kitchen)  113/98 (!) 122/57 102/82  Pulse: 71 (!) 115 (!) 114 90  Resp: (!) 22 (!) 28 (!) 29 (!) 28  Temp:      TempSrc:      SpO2: 100% 100% 100% 100%      Constitutional: Moderately built and nourished. Vitals:   12/08/17 2030 12/08/17 2100 12/08/17 2115 12/08/17 2220  BP: (!) 115/91 (!) 113/98 (!) 122/57 102/82  Pulse: 71 (!) 115 (!) 114 90  Resp: (!) 22 (!) 28 (!) 29 (!) 28  Temp:      TempSrc:      SpO2: 100% 100% 100% 100%   Eyes: Anicteric no pallor. ENMT: No discharge from the ears eyes nose or mouth. Neck: Elevated JVD no mass felt. Respiratory: No rhonchi or crepitations.  Tight air entry  present. Cardiovascular: S1-S2 heard no murmurs appreciated. Abdomen: Soft nontender bowel sounds present. Musculoskeletal: No edema. Skin: No rash. Neurologic: Alert awake oriented to time place and person.  Moves all extremities but lower extremity movements are restricted by back pain. Psychiatric: Appears normal.  Normal affect.   Labs on Admission: I have personally reviewed following labs and imaging studies  CBC: Recent Labs  Lab 12/08/17 1913  WBC 14.7*  HGB 12.4*  HCT 37.3*  MCV 94.2  PLT 368   Basic Metabolic Panel: Recent Labs  Lab 12/08/17 1913  NA 135  K 3.7  CL 102  CO2 10*  GLUCOSE 121*  BUN 35*  CREATININE 3.44*  CALCIUM 8.9   GFR: CrCl cannot be calculated (Unknown ideal weight.). Liver Function Tests: No results for input(s): AST, ALT, ALKPHOS, BILITOT, PROT, ALBUMIN in the last 168 hours. No results for input(s): LIPASE, AMYLASE in the last 168 hours. No results for input(s): AMMONIA in the last 168 hours. Coagulation Profile: No results for input(s): INR, PROTIME in the last 168 hours. Cardiac Enzymes: No results for input(s): CKTOTAL, CKMB, CKMBINDEX, TROPONINI in the last 168 hours. BNP (last 3 results) No results for input(s): PROBNP in the last 8760 hours. HbA1C: No results for input(s): HGBA1C in the last 72 hours. CBG: No results for input(s): GLUCAP in the last 168 hours. Lipid Profile: No results for input(s): CHOL, HDL, LDLCALC, TRIG, CHOLHDL, LDLDIRECT in the last 72 hours. Thyroid Function Tests: No results for input(s): TSH, T4TOTAL, FREET4, T3FREE, THYROIDAB in the last 72 hours. Anemia Panel: No results for input(s): VITAMINB12, FOLATE, FERRITIN, TIBC, IRON, RETICCTPCT in the last 72 hours. Urine analysis:    Component Value Date/Time   COLORURINE AMBER (A) 09/08/2015 0955   APPEARANCEUR CLEAR 09/08/2015 0955   LABSPEC 1.016 09/08/2015 0955   PHURINE 7.0 09/08/2015 0955   GLUCOSEU NEGATIVE 09/08/2015 0955   HGBUR SMALL  (A) 09/08/2015 0955   BILIRUBINUR SMALL (A) 09/08/2015 0955   KETONESUR NEGATIVE 09/08/2015 0955   PROTEINUR 100 (A) 09/08/2015 0955   NITRITE NEGATIVE 09/08/2015 0955   LEUKOCYTESUR NEGATIVE 09/08/2015 0955   Sepsis Labs: @LABRCNTIP (procalcitonin:4,lacticidven:4) )No results found for this or any previous visit (from the past 240 hour(s)).   Radiological Exams on Admission: Dg Chest Portable 1 View  Result Date: 12/08/2017 CLINICAL DATA:  Short of breath and weakness since 630 today EXAM: PORTABLE CHEST 1 VIEW COMPARISON:  09/08/2015 FINDINGS: Mild cardiomegaly. Normal vascularity. Right hemidiaphragm remains elevated. Bibasilar atelectasis. IMPRESSION: Cardiomegaly without decompensation. Bibasilar atelectasis. Electronically Signed   By: Jolaine Click M.D.   On: 12/08/2017 19:43    EKG: Independently reviewed.  A. fib rate mildly elevated.  Assessment/Plan Principal Problem:  Acute respiratory failure with hypoxia (HCC) Active Problems:   HTN (hypertension) with goal to be determined   Acute on chronic systolic heart failure (HCC)   Persistent atrial fibrillation (HCC)   ARF (acute renal failure) (HCC)    1. Acute respiratory failure with hypoxia likely from acute decompensated systolic heart failure last EF measured in 2017 was 40 to 45%.  Presently patient is on BiPAP.  Lasix 40 mg IV every 12 has been ordered.  Since patient has elevated d-dimer will get VQ scan.  We will cycle cardiac markers.  Get cardiology evaluation in the morning.  Will need to reassess EF.  Closely follow intake output metabolic panel.  Patient also takes BiDil. 2. History of atrial fibrillation rate elevated at admission and Cardizem bolus was given.  Patient takes Coreg which will be continued.  Patient is on Xarelto and since patient has renal failure and we do not know his baseline recently we will keep patient on heparin for now.  Check cardiac markers and thyroid function test. 3. Low back pain and  difficulty moving lower extremities due to pain.  MRI lumbar spine has been ordered. 4. Hypertension on BiDil Coreg and Lasix. 5. Renal failure likely acute on chronic last creatinine in our system was in 2017 was 1.4.  We need to check with patient's primary care physician in the morning to know his baseline creatinine.  UA and FENa are pending.  Closely follow intake output. 6. Anemia likely from renal disease appears to be chronic follow CBC.  Check anemia panel.   DVT prophylaxis: Heparin infusion. Code Status: Full code. Family Communication: Patient's son. Disposition Plan: Home. Consults called: Cardiology. Admission status: Inpatient.   Eduard Clos MD [facility] Pager [phone].  If 7PM-7AM, please contact night-coverage [email or web address] Password [facility]  12/08/2017, 10:24 PM

## 2017-12-08 NOTE — Progress Notes (Signed)
Patient arrived via EMS on CPAP. Placed on BIPAP 10/5 BUR 8 40% Fio2. MD at bedside.

## 2017-12-09 ENCOUNTER — Other Ambulatory Visit: Payer: Self-pay

## 2017-12-09 ENCOUNTER — Inpatient Hospital Stay (HOSPITAL_COMMUNITY): Payer: Medicare Other

## 2017-12-09 DIAGNOSIS — I481 Persistent atrial fibrillation: Secondary | ICD-10-CM

## 2017-12-09 DIAGNOSIS — R6510 Systemic inflammatory response syndrome (SIRS) of non-infectious origin without acute organ dysfunction: Secondary | ICD-10-CM

## 2017-12-09 DIAGNOSIS — J9601 Acute respiratory failure with hypoxia: Secondary | ICD-10-CM

## 2017-12-09 DIAGNOSIS — R112 Nausea with vomiting, unspecified: Secondary | ICD-10-CM

## 2017-12-09 DIAGNOSIS — I959 Hypotension, unspecified: Secondary | ICD-10-CM

## 2017-12-09 LAB — MAGNESIUM: Magnesium: 1.7 mg/dL (ref 1.7–2.4)

## 2017-12-09 LAB — CBC
HCT: 30.1 % — ABNORMAL LOW (ref 39.0–52.0)
HEMATOCRIT: 30.4 % — AB (ref 39.0–52.0)
HEMOGLOBIN: 10.2 g/dL — AB (ref 13.0–17.0)
Hemoglobin: 10.1 g/dL — ABNORMAL LOW (ref 13.0–17.0)
MCH: 30.7 pg (ref 26.0–34.0)
MCH: 31.2 pg (ref 26.0–34.0)
MCHC: 33.6 g/dL (ref 30.0–36.0)
MCHC: 33.6 g/dL (ref 30.0–36.0)
MCV: 91.6 fL (ref 78.0–100.0)
MCV: 92.9 fL (ref 78.0–100.0)
PLATELETS: 212 10*3/uL (ref 150–400)
Platelets: 249 10*3/uL (ref 150–400)
RBC: 3.32 MIL/uL — ABNORMAL LOW (ref 4.22–5.81)
RBC: 3.24 MIL/uL — ABNORMAL LOW (ref 4.22–5.81)
RDW: 14.9 % (ref 11.5–15.5)
RDW: 15 % (ref 11.5–15.5)
WBC: 9.8 10*3/uL (ref 4.0–10.5)
WBC: 10.3 10*3/uL (ref 4.0–10.5)

## 2017-12-09 LAB — URINALYSIS, ROUTINE W REFLEX MICROSCOPIC
Glucose, UA: NEGATIVE mg/dL
HGB URINE DIPSTICK: NEGATIVE
Ketones, ur: NEGATIVE mg/dL
Leukocytes, UA: NEGATIVE
Nitrite: NEGATIVE
Protein, ur: NEGATIVE mg/dL
Specific Gravity, Urine: 1.017 (ref 1.005–1.030)
pH: 5 (ref 5.0–8.0)

## 2017-12-09 LAB — TYPE AND SCREEN
ABO/RH(D): O POS
ANTIBODY SCREEN: NEGATIVE

## 2017-12-09 LAB — CORTISOL: Cortisol, Plasma: 10.5 ug/dL

## 2017-12-09 LAB — BASIC METABOLIC PANEL
ANION GAP: 19 — AB (ref 5–15)
BUN: 43 mg/dL — ABNORMAL HIGH (ref 8–23)
CALCIUM: 8.3 mg/dL — AB (ref 8.9–10.3)
CO2: 15 mmol/L — ABNORMAL LOW (ref 22–32)
Chloride: 102 mmol/L (ref 98–111)
Creatinine, Ser: 3.98 mg/dL — ABNORMAL HIGH (ref 0.61–1.24)
GFR calc Af Amer: 16 mL/min — ABNORMAL LOW (ref >60)
GFR calc non Af Amer: 14 mL/min — ABNORMAL LOW (ref >60)
GLUCOSE: 135 mg/dL — AB (ref 70–99)
POTASSIUM: 4.3 mmol/L (ref 3.5–5.1)
Sodium: 136 mmol/L (ref 135–145)

## 2017-12-09 LAB — HIV ANTIBODY (ROUTINE TESTING W REFLEX): HIV Screen 4th Generation wRfx: NONREACTIVE

## 2017-12-09 LAB — HEPATIC FUNCTION PANEL
ALK PHOS: 67 U/L (ref 38–126)
ALT: 37 U/L (ref 0–44)
AST: 111 U/L — ABNORMAL HIGH (ref 15–41)
Albumin: 2.1 g/dL — ABNORMAL LOW (ref 3.5–5.0)
BILIRUBIN TOTAL: 4.9 mg/dL — AB (ref 0.3–1.2)
Bilirubin, Direct: 2.2 mg/dL — ABNORMAL HIGH (ref 0.0–0.2)
Indirect Bilirubin: 2.7 mg/dL — ABNORMAL HIGH (ref 0.3–0.9)
TOTAL PROTEIN: 6.5 g/dL (ref 6.5–8.1)

## 2017-12-09 LAB — MRSA PCR SCREENING: MRSA by PCR: NEGATIVE

## 2017-12-09 LAB — HEPARIN LEVEL (UNFRACTIONATED)
Heparin Unfractionated: <0.1 IU/mL — ABNORMAL LOW (ref 0.30–0.70)
Heparin Unfractionated: 0.37 IU/mL (ref 0.30–0.70)
Heparin Unfractionated: 0.59 IU/mL (ref 0.30–0.70)

## 2017-12-09 LAB — ABO/RH: ABO/RH(D): O POS

## 2017-12-09 LAB — TROPONIN I
TROPONIN I: 0.05 ng/mL — AB (ref <0.03)
TROPONIN I: 0.05 ng/mL — AB (ref <0.03)
Troponin I: 0.04 ng/mL (ref <0.03)

## 2017-12-09 LAB — TSH: TSH: 3.4 u[IU]/mL (ref 0.350–4.500)

## 2017-12-09 LAB — SODIUM, URINE, RANDOM: Sodium, Ur: 18 mmol/L

## 2017-12-09 LAB — PROCALCITONIN: Procalcitonin: 2.54 ng/mL

## 2017-12-09 LAB — APTT: aPTT: >200 seconds (ref 24–36)

## 2017-12-09 LAB — CREATININE, URINE, RANDOM: CREATININE, URINE: 294.68 mg/dL

## 2017-12-09 MED ORDER — PIPERACILLIN-TAZOBACTAM IN DEX 2-0.25 GM/50ML IV SOLN
2.2500 g | Freq: Three times a day (TID) | INTRAVENOUS | Status: DC
  Administered 2017-12-09 – 2017-12-12 (×7): 2.25 g via INTRAVENOUS
  Filled 2017-12-09 (×9): qty 50

## 2017-12-09 MED ORDER — PIPERACILLIN-TAZOBACTAM 3.375 G IVPB
3.3750 g | Freq: Three times a day (TID) | INTRAVENOUS | Status: DC
  Filled 2017-12-09: qty 50

## 2017-12-09 MED ORDER — SODIUM CHLORIDE 0.9 % IV SOLN
INTRAVENOUS | Status: DC
  Administered 2017-12-09 – 2017-12-11 (×4): via INTRAVENOUS

## 2017-12-09 MED ORDER — TECHNETIUM TC 99M DIETHYLENETRIAME-PENTAACETIC ACID
30.0000 | Freq: Once | INTRAVENOUS | Status: AC | PRN
  Administered 2017-12-09: 30 via RESPIRATORY_TRACT

## 2017-12-09 MED ORDER — WHITE PETROLATUM EX OINT
TOPICAL_OINTMENT | CUTANEOUS | Status: AC
  Administered 2017-12-09: 0.2
  Filled 2017-12-09: qty 28.35

## 2017-12-09 MED ORDER — DIGOXIN 0.25 MG/ML IJ SOLN
0.1250 mg | Freq: Once | INTRAMUSCULAR | Status: AC
  Administered 2017-12-09: 0.125 mg via INTRAVENOUS
  Filled 2017-12-09 (×2): qty 2

## 2017-12-09 MED ORDER — SODIUM CHLORIDE 0.9 % IV BOLUS
500.0000 mL | Freq: Once | INTRAVENOUS | Status: AC
  Administered 2017-12-09: 500 mL via INTRAVENOUS

## 2017-12-09 MED ORDER — IOPAMIDOL (ISOVUE-300) INJECTION 61%
INTRAVENOUS | Status: AC
  Filled 2017-12-09: qty 30

## 2017-12-09 MED ORDER — TECHNETIUM TO 99M ALBUMIN AGGREGATED
4.0000 | Freq: Once | INTRAVENOUS | Status: AC | PRN
  Administered 2017-12-09: 4 via INTRAVENOUS

## 2017-12-09 NOTE — Progress Notes (Signed)
Pt taken off BIPAP, placed on 2L [place]. Pt tolerating well at this time. RT will continue to monitor.  

## 2017-12-09 NOTE — Progress Notes (Signed)
Notified MD Elgergawy, D S about patient's morning vitals. BP was 88/67(75) at 0733. Pt was due to transport this AM to [facility]. MD gave the OK to transport patient down to scan. This RN transported patient to scan and gave report to the techs performing scans.

## 2017-12-09 NOTE — Progress Notes (Addendum)
Pt drank water then stated " I feel like something is in my throat." Coughed up dark enemis which he said was "tobacco juice".  Removed condom cath and pt voided approx. 125cc dark tea colored urine. Sent urine specimen down to lab.   Pt has been put on bedpan numerous times but he says he cannot "go". He forgets he is on the bedpan and then just sits. Pt unable to get OOB due to not being able to stand. Pt very forgetful about where he is and why he is here- has been reoriented numerous times. Son answers for father frequently. Pt able to move all ext. No change in neuro status or muscle status since admission.

## 2017-12-09 NOTE — Progress Notes (Signed)
ANTICOAGULATION CONSULT NOTE - follow  up  Pharmacy Consult for Heparin  Indication: atrial fibrillation  Allergies  Allergen Reactions  . Apixaban Other (See Comments)    Stiffness of neck and head  . Lisinopril Swelling    Swelling- angioedema    Patient Measurements: Height: 6' (182.9 cm) Weight: 200 lb 9.9 oz (91 kg) IBW/kg (Calculated) : 77.6 Heparin Dosing Weight: 91 kg  Vital Signs: Temp: 97.8 F (36.6 C) (09/11 1215) Temp Source: Oral (09/11 1215) BP: 80/58 (09/11 1215) Pulse Rate: 86 (09/11 1215)  Labs: Recent Labs    12/08/17 1913 12/08/17 2257 12/09/17 0607 12/09/17 0942  HGB 12.4*  --  10.2*  --   HCT 37.3*  --  30.4*  --   PLT 368  --  249  --   APTT  --  37*  --  >200*  HEPARINUNFRC  --  <0.10* 0.37 0.59  CREATININE 3.44*  --  3.98*  --   TROPONINI  --  0.05* 0.05*  --     Estimated Creatinine Clearance: 19.5 mL/min (A) (by C-G formula based on SCr of 3.98 mg/dL (H)).   Medical History: Past Medical History:  Diagnosis Date  . Atrial fibrillation (HCC)   . CHF (congestive heart failure) (HCC)   . Hypertension   . Obesity     Medications:  Medications Prior to Admission  Medication Sig Dispense Refill Last Dose  . carvedilol (COREG) 25 MG tablet Take 1 tablet (25 mg total) by mouth 2 (two) times daily with a meal. 60 tablet 0 12/08/2017 at Unknown time  . furosemide (LASIX) 40 MG tablet Take 1 tablet (40 mg total) by mouth daily. 30 tablet 0 12/07/2017 at Unknown time  . isosorbide-hydrALAZINE (BIDIL) 20-37.5 MG tablet Take 1 tablet by mouth 3 (three) times daily. 30 tablet 0 12/07/2017 at Unknown time  . Potassium Chloride ER 20 MEQ TBCR Take 20 mEq by mouth daily. 90 tablet 0 12/07/2017 at Unknown time  . rivaroxaban (XARELTO) 20 MG TABS tablet TAKE 1 TABLET DAILY WITH SUPPER 30 tablet 0 12/07/2017  . BIDIL 20-37.5 MG tablet TAKE 1 TABLET THREE TIMES A DAY (Patient not taking: Reported on 12/08/2017) 270 tablet 0 Not Taking at Unknown time    Scheduled:  . digoxin  0.125 mg Intravenous Once    Assessment: 67 y.o male presented to [facility] ED 12/08/17 with increased shortness of breath since 1830, with generalized weakness and in Afib RVR 150s.  On Xarelto 20mg  daily prior to admission for h/o AFib, last taken on 12/07/17.  Pharmacy consulted to start IV heparin drip.  Baseline aPTT 37,  HL <0.1, thus okay to use heparin levels to monitor heparin.  Worsening renal function SCr up to 3.98  Heparin level is 0.59 on heparin 1250 units/hr.    Goal of Therapy:  Heparin level 0.3-0.7 units/ml Monitor platelets by anticoagulation protocol: Yes   Plan:  Reduce Heparin infusion to 1150 units/hr F/u 8 hour heparin level Daily heparin level and CBC.    Thank you for allowing [place] to participate in this patients care.   Signe Colt, PharmD Please utilize Amion (under [facility]) for appropriate number for your unit pharmacist. 12/09/2017 12:27 PM

## 2017-12-09 NOTE — Consult Note (Signed)
Cardiology Consultation:   Patient ID: Tanner Gilbert MRN: [id number]; DOB: Aug 24, 1950  Admit date: 12/08/2017 Date of Consult: 12/09/2017  Primary Care Provider: Pearline Cables, MD Primary Cardiologist: No primary care provider on file. New- Dr. Cristal Deer Primary Electrophysiologist:  None    Patient Profile:   Tanner Gilbert is a 67 y.o. male with a hx of Afib s/p cardioversion with reversal back to Afib x2,, HTN, chronic HFrEF (EF 40-45, 2017) who is being seen today for the evaluation of SOB and weakness at the request of Dr. Toniann Fail.  History of Present Illness:   Mr. Clasby with PMH as above and most recently seen in 2017 by Dr. Mayford Knife and Chelsea Aus, PA-C.   Of note, patient has reported reactions to apixaban (stiffness of neck/head) and lisinopril angioedema. Patient underwent cardioversion 09/2015 for Afib but quickly reverted back twice so was referred to the Afib clinic for further evaluation (2017 echo as below shows LAE).  PTA meds include carvedilol 25mg  BID po, lasix 40mg  po qd, isosorbide - hydralazine 20-37.5mg   Po TID, Kcl po qd, xarelto 20mg  po qhs.  12/08/17: Patient reportedly experienced difficulty walking on the way to the restroom on 9/10. His son, who provided this history, lives with the patient and was present at that time. He attempted to help him get to the restroom and reportedly noticed that the patient felt hot at that time so called EMS. EMS arrived at the scene and found the patient hypoxic. Of note, the son reports that his father was not SOB when unable to reach the restroom, despite the low O2 sats at EMS arrival. The patient was subsequently placed on CPAP with concern for flash pulmonary edema (later imaging not consistent / impressive for flash pulmonary edema or significant volume overload). Patient denied recent illness, n/v/d before presentation to the ED. Per son report, patient has not experienced any recent chest pain, palpitations, rapid  heart rate; however, he does report his father sometimes will "feel his afib." The son also reports that his father eats well with a mediterranean diet heavy in fish and vegetables, quit chewing tobacco 2 to 3 months ago, does not drink alcohol, and reportedly does not use illegal drugs. The son reported his father occasionally weighs himself and recently had increased his water intake. Per son's report, the patient takes all of his medications as prescribed. Also per son's report, the patient has never experienced trouble with ambulation in the past.  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 Physical exam was notable for elevated JVD and LE weakness d/t back pain. Patient was admitted for further management and evaluation. Today 9/11: Given ddimer elevation, patient had VQ scan today that showed low probability of PE but elevation of R hemidiaphragm. F/u US of RUQ could not r/p polyp or small gallbladder mass. Recommendation for liver enzymes. Recommendation to consider MRI/MRCP with and without contrast. Liver with findings consistent for diffuse hepatic steatosis. Patient with severe hypotension and AKI. Heparin started in place of Xarelto. Diuresis stopped.  Cardiology consulted.  Pending echo.  BiPAP at presentation. Now on 2L Sharon and reports improved SOB. BNP elevated and patient documented as volume overloaded at presentation - received Lasix 40mg  IV q12h. Diuresis now stopped given patient with AKI and impaired renal function,  hypotension. Elevated ddimer with VQ showing low suspicion of acute PE.  On exam 9/11, patient is somnolent and cannot stay awake to answer questions regarding patient history. History provided via son, who was in the room at the time and as noted above.    Past Medical History:  Diagnosis Date  . Atrial fibrillation (HCC)   . CHF (congestive heart failure) (HCC)   . Hypertension   . Obesity     Past Surgical History:  Procedure Laterality Date  . CARDIOVERSION N/A 10/15/2015   Procedure: CARDIOVERSION;  Surgeon: Quintella Reichert, MD;  Location: [facility];  Service: Cardiovascular;  Laterality: N/A;     Home Medications:  Prior to Admission medications   Medication Sig Start Date End Date Taking? Authorizing Provider  carvedilol (COREG) 25 MG tablet Take 1 tablet (25 mg total) by mouth 2 (two) times daily with a meal. 07/14/17  Yes Copland, Gwenlyn Found, MD  furosemide (LASIX) 40 MG tablet Take 1 tablet (40 mg total) by mouth daily. 07/14/17  Yes Copland, Gwenlyn Found, MD  isosorbide-hydrALAZINE (BIDIL) 20-37.5 MG tablet Take 1 tablet by mouth 3 (three) times daily. 05/07/16  Yes Copland, Gwenlyn Found, MD  Potassium Chloride ER 20 MEQ TBCR Take 20 mEq by mouth daily. 12/19/16  Yes Copland, Gwenlyn Found, MD  rivaroxaban (XARELTO) 20 MG TABS tablet TAKE 1 TABLET DAILY WITH SUPPER 07/14/17  Yes Copland, Gwenlyn Found, MD  BIDIL 20-37.5 MG tablet TAKE 1 TABLET THREE TIMES A DAY Patient not taking: Reported on 12/08/2017 12/12/16   Copland, Gwenlyn Found, MD    Inpatient Medications: Scheduled Meds:  Continuous Infusions: . sodium chloride 75 mL/hr at 12/09/17 1200  . piperacillin-tazobactam (ZOSYN)  IV     PRN Meds: acetaminophen **OR** acetaminophen, ondansetron **OR** ondansetron (ZOFRAN) IV  Allergies:    Allergies  Allergen Reactions  . Apixaban Other (See Comments)    Stiffness of neck and head  . Lisinopril Swelling    Swelling- angioedema    Social History:   Social History   Socioeconomic  History  . Marital status: Single    Spouse name: Not on file  . Number of children: Not on file  . Years of education: Not on file  . Highest education level: Not on file  Occupational History  . Not on file  Social Needs  . Financial resource strain: Not on file  . Food insecurity:    Worry: Not on file    Inability: Not on file  . Transportation needs:    Medical: Not on file    Non-medical: Not on file  Tobacco Use  . Smoking status: Never Smoker  . Smokeless tobacco: Never Used  Substance and Sexual Activity  . Alcohol use: No  . Drug use: No  . Sexual activity: Yes  Lifestyle  . Physical activity:    Days per week: Not on file    Minutes per session: Not on file  . Stress: Not on file  Relationships  . Social connections:    Talks on phone: Not on file    Gets together: Not on file    Attends religious service: Not on file    Active member of club or organization: Not on file    Attends  meetings of clubs or organizations: Not on file    Relationship status: Not on file  . Intimate partner violence:    Fear of current or ex partner: Not on file    Emotionally abused: Not on file    Physically abused: Not on file    Forced sexual activity: Not on file  Other Topics Concern  . Not on file  Social History Narrative  . Not on file    Family History:    Family History  Family history unknown: Yes     ROS:  Please see the history of present illness.   All other ROS reviewed and negative.     Physical Exam/Data:   Vitals:   12/09/17 1215 12/09/17 1300 12/09/17 1400 12/09/17 1630  BP: (!) 80/58 (!) 82/60 (!) 80/65 115/75  Pulse: 86 70 87 (!) 52  Resp:  (!) 22 20   Temp: 97.8 F (36.6 C)   97.6 F (36.4 C)  TempSrc: Oral   Oral  SpO2: 100% 98% 100% 100%  Weight:      Height:        Intake/Output Summary (Last 24 hours) at 12/09/2017 1657 Last data filed at 12/09/2017 1500 Gross per 24 hour  Intake 883.61 ml  Output 225 ml  Net 658.61 ml    Filed Weights   12/08/17 2220  Weight: 91 kg   Body mass index is 27.21 kg/m.  General:  Somnolent. Frail elderly gentleman. Not able to remain conscious during exam. HEENT: normal Lymph: no adenopathy Neck: no JVD. Flat. Endocrine:  No thryomegaly Vascular: No carotid bruits; FA pulses 2+ bilaterally without bruits  Cardiac:  normal S1, S2; RRR; no murmur  Lungs:  clear to auscultation bilaterally, no wheezing, rhonchi or rales  Abd: soft, somewhat tender as patient awakes with palpation Ext: No pitting LEE edema Musculoskeletal:  No deformities, BUE and BLE strength not assessed d/t somnolence Skin: warm and dry  Psych:  Somnolent.   EKG:  As above in HPI Telemetry:  Telemetry was personally reviewed and demonstrates:  Afib, rates 80-90s  Relevant CV Studies:  08/2015  TTE - Left ventricle: The cavity size was normal. There was mild   concentric hypertrophy. Systolic function was mildly to   moderately reduced. The estimated ejection fraction was in the   range of 40% to 45%. Mild diffuse hypokinesis. - Aortic valve: There was trivial regurgitation. - Mitral valve: There was mild regurgitation. - Left atrium: The atrium was severely dilated.  Laboratory Data:  Chemistry Recent Labs  Lab 12/08/17 1913 12/09/17 0607  NA 135 136  K 3.7 4.3  CL 102 102  CO2 10* 15*  GLUCOSE 121* 135*  BUN 35* 43*  CREATININE 3.44* 3.98*  CALCIUM 8.9 8.3*  GFRNONAA 17* 14*  GFRAA 20* 16*  ANIONGAP 23* 19*    Recent Labs  Lab 12/09/17 0607  PROT 6.5  ALBUMIN 2.1*  AST 111*  ALT 37  ALKPHOS 67  BILITOT 4.9*   Hematology Recent Labs  Lab 12/08/17 1913 12/09/17 0607  WBC 14.7* 9.8  RBC 3.96* 3.32*  HGB 12.4* 10.2*  HCT 37.3* 30.4*  MCV 94.2 91.6  MCH 31.3 30.7  MCHC 33.2 33.6  RDW 15.2 14.9  PLT 368 249   Cardiac Enzymes Recent Labs  Lab 12/08/17 2257 12/09/17 0607 12/09/17 1435  TROPONINI 0.05* 0.05* 0.04*    Recent Labs  Lab 12/08/17 1923   TROPIPOC 0.05    BNP Recent Labs  Lab 12/08/17  1913  BNP 976.0*    DDimer  Recent Labs  Lab 12/08/17 1938  DDIMER 1.39*    Radiology/Studies:  Nm Pulmonary Perf And Vent  Result Date: 12/09/2017 CLINICAL DATA:  Shortness of breath EXAM: NUCLEAR MEDICINE VENTILATION - PERFUSION LUNG SCAN VIEWS: Anterior, posterior, LPO, RPO, LAO, RAO-ventilation and perfusion RADIOPHARMACEUTICALS:  31.2 mCi of Tc-21m DTPA aerosol inhalation and 4.2 mCi Tc93m-MAA IV COMPARISON:  Chest radiograph December 08, 2017 FINDINGS: Ventilation: Radiotracer uptake on the ventilation study is homogeneous and symmetric bilaterally. No ventilation defects are evident. There is elevation of the right hemidiaphragm, noted on chest radiograph. Perfusion: Radiotracer uptake is homogeneous and symmetric bilaterally. No perfusion defects are evident. There is elevation of the right hemidiaphragm. IMPRESSION: No appreciable ventilation or perfusion defect. This study constitutes a very low probability of pulmonary embolus. There is elevation of the right hemidiaphragm. Electronically Signed   By: Bretta Bang III M.D.   On: 12/09/2017 10:05   Dg Chest Portable 1 View  Result Date: 12/08/2017 CLINICAL DATA:  Short of breath and weakness since 630 today EXAM: PORTABLE CHEST 1 VIEW COMPARISON:  09/08/2015 FINDINGS: Mild cardiomegaly. Normal vascularity. Right hemidiaphragm remains elevated. Bibasilar atelectasis. IMPRESSION: Cardiomegaly without decompensation. Bibasilar atelectasis. Electronically Signed   By: Jolaine Click M.D.   On: 12/08/2017 19:43   Dg Abd Portable 1v  Result Date: 12/09/2017 CLINICAL DATA:  Nausea and vomiting EXAM: PORTABLE ABDOMEN - 1 VIEW COMPARISON:  Abdominal ultrasound of December 09, 2017 FINDINGS: The stomach is moderately distended with air. No small or large bowel obstruction is observed. The stool burden does not appear excessive. There are no abnormal soft tissue calcifications. There are  degenerative changes at the L3-4 and L4-5 disc levels. IMPRESSION: Moderate gaseous distention of the stomach may reflect gastric outlet obstruction. No evidence of a small or large bowel obstructive process. Electronically Signed   By: David  [place] M.D.   On: 12/09/2017 15:17   US Abdomen Limited Ruq  Result Date: 12/09/2017 CLINICAL DATA:  Hyperbilirubinemia EXAM: ULTRASOUND ABDOMEN LIMITED RIGHT UPPER QUADRANT COMPARISON:  None. FINDINGS: Gallbladder: Poor visualization of the gallbladder which appears to be contracted and mildly thick-walled at 3 mm. Orientation of the gallbladder and patient body habitus made assessment problematic. Small nonshadowing filling defects in the gallbladder for example on image 5 potentially from tumefactive sludge. This was not interrogated by Doppler to assess for solid mass. Strictly speaking a gallbladder polyp could have a similar appearance for example on image 8. Sonographic Eulah Pont sign was absent.  No definite shadowing calculi. Common bile duct: Diameter: 4 mm Liver: Coarse echogenic liver with poor sonic penetration compatible with diffuse hepatic steatosis. Portal vein is patent on color Doppler imaging with normal direction of blood flow towards the liver. IMPRESSION: 1. Technically difficult exam due to orientation of the gallbladder and the patient's body habitus. Also the gallbladder appears somewhat contracted and mildly thick-walled, although sonographic Murphy sign was absent. There is some complex rounded filling defect in the gallbladder which is probably tumefactive sludge, along with several small nonshadowing intraluminal hyperechogenicies. The possibility of a polyp or small gallbladder mass is not excluded on today's exam. Given the limited liver enzymes and constellation of findings, consider MRI/MRCP with and without contrast for further characterization. 2. Coarse echogenic liver with poor sonic penetration compatible with diffuse hepatic steatosis.  Electronically Signed   By: Gaylyn Rong M.D.   On: 12/09/2017 12:50    Assessment and Plan:   1. SOB with h/o  Chronic systolic HF, chronic AFib - EF 2017 40-45% 2017. Ordered updated echo. Pending results. - - Renal function Caution: Patient with Cr 3.44  3.98 (baseline 1.3-1.5) that should be considered if further workup involves contrast procedures.  - Discontinued heparin.  Patient switched from home Xarelto to heparin given renal impairment. Discontinue heparin and continue to hold Xarelto in setting of reduced renal function. Will need to reduce Xarelto home dosage to 15mg  daily at discharge given impaired renal function as per prescription guidelines  - On exam, patient with coffee ground emesis (emesis 4x today). No signs of volume overload on physical exam. Rate increase likely due to hypotension, emesis, and in the setting of likely GI issue, emesis, sepsis per procalcitonin lab. - Low suspicion for cardiac etiology or HFrEF exacerbation. Patient imaging, physical exam, labs, and story (as per son) is not consistent with volume overload or cardiac etiology.  - Will update echo and discuss with internal medicine.  No plan for further cardiac workup at this time.  2. Chronic Afib s/p cardioversion attempt x2 in 2017 - CHA2DS2VASc score of at least (CHF 1, HTN 1, age 59) = 3 - Cardizem given in ED -stopped d/t hypotension. Coreg held d/t hypotension. Continue to hold both. - Discontinue heparin and Xarelto d/t impaired renal function as above and low suspicion for cardiac etiology. Recommend if discharge with this, renal dosing of Xarelto. - Emesis x4 today 9/11- Monitor K for any changes. Daily BMP. K 3.7  4.3. TSH 3.4 - Continue to monitor on telemetry.  3. AKI - Cr 3.44  3.98 - Recommendations for home Xarelto dose adjustments as above if patient discharged with this medication. - Per IM  4. H/o HTN with Hypotension this Admission - Hypotensive this admission likely in  setting of dehydration, diuresis, and anti-hypertensives, and as listed in #1 above. Continue IVF. Continue to hold home anti-hypertensive meds. - Per IM  ARF - Monitor renal function with diuresis. Avoid nephrotoxins, contrast procedures. Adjust dosage of medications for renal impairment. - Per IM  Remainder per IM  For questions or updates, please contact [facility] Please consult [email or web address] for contact info under   Signed, Lennon Alstrom, PA-C  12/09/2017 4:57 PM

## 2017-12-09 NOTE — Progress Notes (Signed)
PROGRESS NOTE                                                                                                                                                                                                             Patient Demographics:    Tanner Gilbert, is a 67 y.o. male, DOB - 08/06/1950, ZOX:[id number]  Admit date - 12/08/2017   Admitting Physician Eduard Clos, MD  Outpatient Primary MD for the patient is Copland, Gwenlyn Found, MD  LOS - 1   Chief Complaint  Patient presents with  . Shortness of Breath       Brief Narrative    67 y.o. male with history of atrial fibrillation, chronic systolic heart failure last EF measured in 2017 was 40 to 45%, hypertension was brought to the ER because of shortness of breath, initial suspicion was for acute on chronic systolic CH, PE ruled out with VQ scan, as well he was noted to have acute on chronic renal failure, hypotensive.   Subjective:    Tanner Gilbert today had couple episodes of projectile vomiting, reports is feeling better, dyspnea has improved .   Assessment  & Plan :    Principal Problem:   Acute respiratory failure with hypoxia (HCC) Active Problems:   HTN (hypertension) with goal to be determined   Acute on chronic systolic heart failure (HCC)   Persistent atrial fibrillation (HCC)   ARF (acute renal failure) (HCC)  Acute respiratory failure with hypoxia  -Thought to be secondary to acute on chronic systolic CHF, EF 40 to 45% in 8119, initially requiring BiPAP, significantly improved with IV diuresis . -Currently on 2 L nasal cannula with no evidence of volume overload after diuresis, echo actually giving some IV fluids . -VQ scan negative for PE . -Continue to hold BiDil, Coreg and Lasix in the setting of hypotension  Acute on chronic systolic CHF -Felt to be volume overloaded on admission, was on IV diuresis, last echo 2017, will repeat, given hypotension we will hold  diuresis.  History of atrial fibrillation  -Titrate elevated on admission, improved with Cardizem bolus, currently hypotensive, will hold Cardizem and Coreg as well . -He is on Xarelto at home, on hold given worsening renal function, continue with heparin drip .  Low back pain and difficulty moving lower extremities due to pain.  -Follow  on lumbar MRI  Hypotension -Likely in the setting of diuresis, multiple antihypertensive medication, and dehydration, hold all antihypertensive meds, he was given 2 fluid boluses for now, continue with IV fluids, I will check cortisol level, trend procalcitonin to ensure there is no infection. -If patient remains hypotensive after fluid boluses, will start on stress dose steroids  AKI on CKD -1.4, 3.4 on admission, worsened to 3.9 with diuresis today, I will give IV fluids recheck in a.m.Marland Kitchen  Hyperbilirubinemia -Check right upper quadrant ultrasound      Code Status : Full  Family Communication  : Son at bedside  Disposition Plan  : remains in stepdown  Consults  :  none  Procedures  : none  DVT Prophylaxis  :  Heparin GTT  Lab Results  Component Value Date   PLT 249 12/09/2017    Antibiotics  :    Anti-infectives (From admission, onward)   None        Objective:   Vitals:   12/09/17 0733 12/09/17 1000 12/09/17 1100 12/09/17 1144  BP: (!) 88/67 (!) 74/64 (!) 74/56 (!) 75/59  Pulse: 96 98 85 95  Resp: (!) 27 (!) 22 (!) 26 20  Temp: 97.6 F (36.4 C)     TempSrc: Oral     SpO2: 100% 98% 99% 98%  Weight:      Height:        Wt Readings from Last 3 Encounters:  12/08/17 91 kg  10/10/15 134.1 kg  10/08/15 135.4 kg     Intake/Output Summary (Last 24 hours) at 12/09/2017 1150 Last data filed at 12/09/2017 0342 Gross per 24 hour  Intake -  Output 225 ml  Net -225 ml     Physical Exam  Awake Alert, Oriented X 3, No new F.N deficits, Normal affect Symmetrical Chest wall movement, air entry diminished at the bases, no  wheezing or rales IRR IRR,No Gallops,Rubs or new Murmurs, No Parasternal Heave +ve B.Sounds, Abd Soft, No tenderness,  No rebound - guarding or rigidity. No Cyanosis, Clubbing or edema, No new Rash or bruise     Data Review:    CBC Recent Labs  Lab 12/08/17 1913 12/09/17 0607  WBC 14.7* 9.8  HGB 12.4* 10.2*  HCT 37.3* 30.4*  PLT 368 249  MCV 94.2 91.6  MCH 31.3 30.7  MCHC 33.2 33.6  RDW 15.2 14.9    Chemistries  Recent Labs  Lab 12/08/17 1913 12/08/17 2257 12/09/17 0607  NA 135  --  136  K 3.7  --  4.3  CL 102  --  102  CO2 10*  --  15*  GLUCOSE 121*  --  135*  BUN 35*  --  43*  CREATININE 3.44*  --  3.98*  CALCIUM 8.9  --  8.3*  MG  --  1.7  --   AST  --   --  111*  ALT  --   --  37  ALKPHOS  --   --  67  BILITOT  --   --  4.9*   ------------------------------------------------------------------------------------------------------------------ No results for input(s): CHOL, HDL, LDLCALC, TRIG, CHOLHDL, LDLDIRECT in the last 72 hours.  Lab Results  Component Value Date   HGBA1C 5.9 10/08/2015   ------------------------------------------------------------------------------------------------------------------ Recent Labs    12/08/17 2257  TSH 3.400   ------------------------------------------------------------------------------------------------------------------ No results for input(s): VITAMINB12, FOLATE, FERRITIN, TIBC, IRON, RETICCTPCT in the last 72 hours.  Coagulation profile No results for input(s): INR, PROTIME in the last 168 hours.  Recent  Labs    12/08/17 1938  DDIMER 1.39*    Cardiac Enzymes Recent Labs  Lab 12/08/17 2257 12/09/17 0607  TROPONINI 0.05* 0.05*   ------------------------------------------------------------------------------------------------------------------    Component Value Date/Time   BNP 976.0 (H) 12/08/2017 1913   BNP 336.8 (H) 08/21/2014 1624    Inpatient Medications  Scheduled Meds: Continuous  Infusions: . heparin 1,250 Units/hr (12/09/17 0002)   PRN Meds:.acetaminophen **OR** acetaminophen, ondansetron **OR** ondansetron (ZOFRAN) IV  Micro Results Recent Results (from the past 240 hour(s))  MRSA PCR Screening     Status: None   Collection Time: 12/08/17 10:44 PM  Result Value Ref Range Status   MRSA by PCR NEGATIVE NEGATIVE Final    Comment:        The GeneXpert MRSA Assay (FDA approved for NASAL specimens only), is one component of a comprehensive MRSA colonization surveillance program. It is not intended to diagnose MRSA infection nor to guide or monitor treatment for MRSA infections. Performed at [facility], [address] [place], [place] [address]     Radiology Reports Nm Pulmonary Perf And Vent  Result Date: 12/09/2017 CLINICAL DATA:  Shortness of breath EXAM: NUCLEAR MEDICINE VENTILATION - PERFUSION LUNG SCAN VIEWS: Anterior, posterior, LPO, RPO, LAO, RAO-ventilation and perfusion RADIOPHARMACEUTICALS:  31.2 mCi of Tc-12m DTPA aerosol inhalation and 4.2 mCi Tc70m-MAA IV COMPARISON:  Chest radiograph December 08, 2017 FINDINGS: Ventilation: Radiotracer uptake on the ventilation study is homogeneous and symmetric bilaterally. No ventilation defects are evident. There is elevation of the right hemidiaphragm, noted on chest radiograph. Perfusion: Radiotracer uptake is homogeneous and symmetric bilaterally. No perfusion defects are evident. There is elevation of the right hemidiaphragm. IMPRESSION: No appreciable ventilation or perfusion defect. This study constitutes a very low probability of pulmonary embolus. There is elevation of the right hemidiaphragm. Electronically Signed   By: Bretta Bang III M.D.   On: 12/09/2017 10:05   Dg Chest Portable 1 View  Result Date: 12/08/2017 CLINICAL DATA:  Short of breath and weakness since 630 today EXAM: PORTABLE CHEST 1 VIEW COMPARISON:  09/08/2015 FINDINGS: Mild cardiomegaly. Normal vascularity. Right  hemidiaphragm remains elevated. Bibasilar atelectasis. IMPRESSION: Cardiomegaly without decompensation. Bibasilar atelectasis. Electronically Signed   By: Jolaine Click M.D.   On: 12/08/2017 19:43     Huey Bienenstock M.D on 12/09/2017 at 11:50 AM  Between 7am to 7pm - Pager - [phone]  After 7pm go to [email or web address] - password [facility]  [facility] -  Office  [phone]

## 2017-12-09 NOTE — Progress Notes (Signed)
PHARMACY NOTE:  ANTIMICROBIAL RENAL DOSAGE ADJUSTMENT  Current antimicrobial regimen includes a mismatch between antimicrobial dosage and estimated renal function.  As per policy approved by the Pharmacy & Therapeutics and Medical Executive Committees, the antimicrobial dosage will be adjusted accordingly.  Current antimicrobial dosage:  Zosyn 3.375g IV every 8 hours- 4 hour infusion.  Indication: possible intra-abdominal infection  Renal Function:  Estimated Creatinine Clearance: 19.5 mL/min (A) (by C-G formula based on SCr of 3.98 mg/dL (H)). []      On intermittent HD, scheduled: []      On CRRT    Antimicrobial dosage has been changed to:  Zosyn 2.25g IV every 8 hours.   Additional comments:   Thank you for allowing pharmacy to be a part of this patient's care.  Fayne Norrie, [facility] 12/09/2017 5:46 PM

## 2017-12-10 ENCOUNTER — Inpatient Hospital Stay (HOSPITAL_COMMUNITY): Payer: Medicare Other

## 2017-12-10 DIAGNOSIS — I5023 Acute on chronic systolic (congestive) heart failure: Secondary | ICD-10-CM

## 2017-12-10 DIAGNOSIS — R112 Nausea with vomiting, unspecified: Secondary | ICD-10-CM

## 2017-12-10 DIAGNOSIS — I351 Nonrheumatic aortic (valve) insufficiency: Secondary | ICD-10-CM

## 2017-12-10 DIAGNOSIS — N179 Acute kidney failure, unspecified: Secondary | ICD-10-CM

## 2017-12-10 DIAGNOSIS — R579 Shock, unspecified: Secondary | ICD-10-CM

## 2017-12-10 LAB — CBC
HCT: 29.6 % — ABNORMAL LOW (ref 39.0–52.0)
Hemoglobin: 10.1 g/dL — ABNORMAL LOW (ref 13.0–17.0)
MCH: 31.1 pg (ref 26.0–34.0)
MCHC: 34.1 g/dL (ref 30.0–36.0)
MCV: 91.1 fL (ref 78.0–100.0)
PLATELETS: 219 10*3/uL (ref 150–400)
RBC: 3.25 MIL/uL — ABNORMAL LOW (ref 4.22–5.81)
RDW: 15 % (ref 11.5–15.5)
WBC: 10.9 10*3/uL — ABNORMAL HIGH (ref 4.0–10.5)

## 2017-12-10 LAB — HEPATIC FUNCTION PANEL
ALK PHOS: 55 U/L (ref 38–126)
ALT: 35 U/L (ref 0–44)
AST: 80 U/L — ABNORMAL HIGH (ref 15–41)
Albumin: 2.1 g/dL — ABNORMAL LOW (ref 3.5–5.0)
BILIRUBIN DIRECT: 1.7 mg/dL — AB (ref 0.0–0.2)
BILIRUBIN INDIRECT: 1.9 mg/dL — AB (ref 0.3–0.9)
TOTAL PROTEIN: 6.2 g/dL — AB (ref 6.5–8.1)
Total Bilirubin: 3.6 mg/dL — ABNORMAL HIGH (ref 0.3–1.2)

## 2017-12-10 LAB — BASIC METABOLIC PANEL
Anion gap: 16 — ABNORMAL HIGH (ref 5–15)
BUN: 58 mg/dL — ABNORMAL HIGH (ref 8–23)
CALCIUM: 8.1 mg/dL — AB (ref 8.9–10.3)
CHLORIDE: 103 mmol/L (ref 98–111)
CO2: 18 mmol/L — AB (ref 22–32)
CREATININE: 4.53 mg/dL — AB (ref 0.61–1.24)
GFR calc non Af Amer: 12 mL/min — ABNORMAL LOW (ref >60)
GFR, EST AFRICAN AMERICAN: 14 mL/min — AB (ref >60)
Glucose, Bld: 105 mg/dL — ABNORMAL HIGH (ref 70–99)
Potassium: 3.4 mmol/L — ABNORMAL LOW (ref 3.5–5.1)
SODIUM: 137 mmol/L (ref 135–145)

## 2017-12-10 LAB — ECHOCARDIOGRAM COMPLETE
HEIGHTINCHES: 72 in
WEIGHTICAEL: 3209.9 [oz_av]

## 2017-12-10 LAB — LACTIC ACID, PLASMA
LACTIC ACID, VENOUS: 1.7 mmol/L (ref 0.5–1.9)
Lactic Acid, Venous: 1.5 mmol/L (ref 0.5–1.9)

## 2017-12-10 LAB — SODIUM, URINE, RANDOM: SODIUM UR: 26 mmol/L

## 2017-12-10 LAB — CREATININE, URINE, RANDOM: CREATININE, URINE: 152.51 mg/dL

## 2017-12-10 LAB — PROCALCITONIN: PROCALCITONIN: 2.59 ng/mL

## 2017-12-10 MED ORDER — SODIUM CHLORIDE 0.9 % IV BOLUS
500.0000 mL | Freq: Once | INTRAVENOUS | Status: AC
  Administered 2017-12-10: 500 mL via INTRAVENOUS

## 2017-12-10 MED ORDER — SODIUM CHLORIDE 0.9 % IV BOLUS
1000.0000 mL | Freq: Once | INTRAVENOUS | Status: AC
  Administered 2017-12-10: 1000 mL via INTRAVENOUS

## 2017-12-10 MED ORDER — PHENYLEPHRINE HCL-NACL 10-0.9 MG/250ML-% IV SOLN
0.0000 ug/min | INTRAVENOUS | Status: DC
  Filled 2017-12-10: qty 250

## 2017-12-10 MED ORDER — HYDROCORTISONE NA SUCCINATE PF 100 MG IJ SOLR
100.0000 mg | Freq: Three times a day (TID) | INTRAMUSCULAR | Status: DC
  Administered 2017-12-10 (×3): 100 mg via INTRAVENOUS
  Filled 2017-12-10 (×3): qty 2

## 2017-12-10 NOTE — Consult Note (Signed)
[facility] Renal Consultation Note  Requesting MD:  Dr. Waldron Labs Indication for Consultation: AoCKD  HPI:  Tanner Gilbert is a 67 y.o. male with history of atrial fibrillation, chronic systolic heart failure last EF measured in 2017 was 40 to 45%, hypertension who presented to [facility] ED 9/10PM via EMS with dyspnea and is currently admitted with respiratory failure and possible sepsis of unclear source.  Nephrology is consulted for AoCKD.  His baseline creatinine as of 2017 was 1.5.  On presentation 9/10 it was 3.44, 9/11 3.90, 9/12 4.53.    On presentation he was hypotensive with documented BPs in the ED of 100s. WBC 14.7.  CXR with atelectasis.  +Ddimer --> V/Q negative PE. His pulmonary status has improved from requiring Bipap to now 2Lnc.  He was in A fib with RVR which improved with IV cardizem.  He was given lasix 40 IV initially given h/o CHF but didn't really diurese.  UOP is documented as only 164m on day of admission, none since though there is 2051mof dark amber urine in his foley bag currently.     He was having projectile vomiting and although initial X rays concerning for obstruction, noncontrasted CT did not find any.  GI symptoms improving with NG suction, +BM.  He did have a psoas hematoma noted (on xarelto at home).  He's been treated with IV antibiotics (currently zosyn, culture neg).  In the past 24 hours he's had ongoing hypotension into 70s given 2L NS boluses, stress dose hydrocortisone.  Phenylephrine ordered but not required to date.  TTE shows mild LVH 60-65%, no effusion.   When I spoke with he and his son he generally has no current complaints except dry mouth.  He is oriented to only person but wants to know if he will be released today.  His son feels that he is a bit confused but has confusion at home after taking his BP medication commonly.    EmGoldman Gilbert a [age].o. male.   Creatinine  Date/Time Value Ref Range Status  10/09/2015 1.4 (A) 0.6 - 1.3  mg/dL Final   Creat  Date/Time Value Ref Range Status  08/21/2014 02:21 PM 1.29 0.50 - 1.35 mg/dL Final  09/09/2013 12:01 PM 1.31 0.50 - 1.35 mg/dL Final  09/08/2012 01:17 PM 1.23 0.50 - 1.35 mg/dL Final   Creatinine, Ser  Date/Time Value Ref Range Status  12/10/2017 04:26 AM 4.53 (H) 0.61 - 1.24 mg/dL Final  12/09/2017 06:07 AM 3.98 (H) 0.61 - 1.24 mg/dL Final  12/08/2017 07:13 PM 3.44 (H) 0.61 - 1.24 mg/dL Final  09/11/2015 08:05 AM 1.31 (H) 0.61 - 1.24 mg/dL Final  09/10/2015 04:47 AM 1.56 (H) 0.61 - 1.24 mg/dL Final  09/09/2015 01:48 AM 1.39 (H) 0.61 - 1.24 mg/dL Final  09/08/2015 09:30 AM 1.25 (H) 0.61 - 1.24 mg/dL Final  09/05/2010 08:31 PM 1.60 (H) 0.4 - 1.5 mg/dL Final    PMHx:   Past Medical History:  Diagnosis Date  . Atrial fibrillation ([place]  . CHF (congestive heart failure) ([place]  . Hypertension   . Obesity     Past Surgical History:  Procedure Laterality Date  . CARDIOVERSION N/A 10/15/2015   Procedure: CARDIOVERSION;  Surgeon: TrSueanne MargaritaMD;  Location: [facility] END[facility];  Service: Cardiovascular;  Laterality: N/A;    Family Hx:  Family History  Family history unknown: Yes    Social History:  reports that he has never smoked. He has never used smokeless tobacco. He  reports that he does not drink alcohol or use drugs.  Allergies:  Allergies  Allergen Reactions  . Apixaban Other (See Comments)    Stiffness of neck and head  . Lisinopril Swelling    Swelling- angioedema    Medications: Prior to Admission medications   Medication Sig Start Date End Date Taking? Authorizing Provider  carvedilol (COREG) 25 MG tablet Take 1 tablet (25 mg total) by mouth 2 (two) times daily with a meal. 07/14/17  Yes Copland, Gay Filler, MD  furosemide (LASIX) 40 MG tablet Take 1 tablet (40 mg total) by mouth daily. 07/14/17  Yes Copland, Gay Filler, MD  isosorbide-hydrALAZINE (BIDIL) 20-37.5 MG tablet Take 1 tablet by mouth 3 (three) times daily. 05/07/16  Yes Copland,  Gay Filler, MD  Potassium Chloride ER 20 MEQ TBCR Take 20 mEq by mouth daily. 12/19/16  Yes Copland, Gay Filler, MD  rivaroxaban (XARELTO) 20 MG TABS tablet TAKE 1 TABLET DAILY WITH SUPPER 07/14/17  Yes Copland, Gay Filler, MD  BIDIL 20-37.5 MG tablet TAKE 1 TABLET THREE TIMES A DAY Patient not taking: Reported on 12/08/2017 12/12/16   Copland, Gay Filler, MD    I have reviewed the patient's current medications.  Labs:  Results for orders placed or performed during the hospital encounter of 12/08/17 (from the past 48 hour(s))  Basic metabolic panel     Status: Abnormal   Collection Time: 12/08/17  7:13 PM  Result Value Ref Range   Sodium 135 135 - 145 mmol/L   Potassium 3.7 3.5 - 5.1 mmol/L    Comment: HEMOLYSIS AT THIS LEVEL MAY AFFECT RESULT   Chloride 102 98 - 111 mmol/L   CO2 10 (L) 22 - 32 mmol/L   Glucose, Bld 121 (H) 70 - 99 mg/dL   BUN 35 (H) 8 - 23 mg/dL   Creatinine, Ser 3.44 (H) 0.61 - 1.24 mg/dL   Calcium 8.9 8.9 - 10.3 mg/dL   GFR calc non Af Amer 17 (L) >60 mL/min   GFR calc Af Amer 20 (L) >60 mL/min    Comment: (NOTE) The eGFR has been calculated using the CKD EPI equation. This calculation has not been validated in all clinical situations. eGFR's persistently <60 mL/min signify possible Chronic Kidney Disease.    Anion gap 23 (H) 5 - 15    Comment: Performed at [facility], [place] [address] [place], [place] [address]  CBC     Status: Abnormal   Collection Time: 12/08/17  7:13 PM  Result Value Ref Range   WBC 14.7 (H) 4.0 - 10.5 K/uL   RBC 3.96 (L) 4.22 - 5.81 MIL/uL   Hemoglobin 12.4 (L) 13.0 - 17.0 g/dL   HCT 37.3 (L) 39.0 - 52.0 %   MCV 94.2 78.0 - 100.0 fL   MCH 31.3 26.0 - 34.0 pg   MCHC 33.2 30.0 - 36.0 g/dL   RDW 15.2 11.5 - 15.5 %   Platelets 368 150 - 400 K/uL    Comment: Performed at [facility], [place] [address], [place], [place] [address]  Brain natriuretic peptide     Status: Abnormal   Collection Time: 12/08/17  7:13 PM  Result Value Ref  Range   B Natriuretic Peptide 976.0 (H) 0.0 - 100.0 pg/mL    Comment: Performed at [facility] [address] [place], [place] [address]  I-stat troponin, ED     Status: None   Collection Time: 12/08/17  7:23 PM  Result Value  Ref Range   Troponin i, poc 0.05 0.00 - 0.08 ng/mL   Comment 3            Comment: Due to the release kinetics of cTnI, a negative result within the first hours of the onset of symptoms does not rule out myocardial infarction with certainty. If myocardial infarction is still suspected, repeat the test at appropriate intervals.   D-dimer, quantitative (not at [facility])     Status: Abnormal   Collection Time: 12/08/17  7:38 PM  Result Value Ref Range   D-Dimer, Quant 1.39 (H) 0.00 - 0.50 ug/mL-FEU    Comment: (NOTE) At the manufacturer cut-off of 0.50 ug/mL FEU, this assay has been documented to exclude PE with a sensitivity and negative predictive value of 97 to 99%.  At this time, this assay has not been approved by the FDA to exclude DVT/VTE. Results should be correlated with clinical presentation. Performed at [facility], [place] [address] [place], [place] [address]   Urinalysis, Routine w reflex microscopic     Status: Abnormal   Collection Time: 12/08/17 10:25 PM  Result Value Ref Range   Color, Urine AMBER (A) YELLOW    Comment: BIOCHEMICALS MAY BE AFFECTED BY COLOR   APPearance CLEAR CLEAR   Specific Gravity, Urine 1.017 1.005 - 1.030   pH 5.0 5.0 - 8.0   Glucose, UA NEGATIVE NEGATIVE mg/dL   Hgb urine dipstick NEGATIVE NEGATIVE   Bilirubin Urine SMALL (A) NEGATIVE   Ketones, ur NEGATIVE NEGATIVE mg/dL   Protein, ur NEGATIVE NEGATIVE mg/dL   Nitrite NEGATIVE NEGATIVE   Leukocytes, UA NEGATIVE NEGATIVE    Comment: Performed at [facility] [address] [place], [place] [address]  Creatinine, urine, random     Status: None   Collection Time: 12/08/17 10:25 PM  Result Value Ref Range   Creatinine, Urine 294.68 mg/dL     Comment: Performed at [facility] [address] [place], [place] [address]  Sodium, urine, random     Status: None   Collection Time: 12/08/17 10:25 PM  Result Value Ref Range   Sodium, Ur 18 mmol/L    Comment: Performed at [facility] [address] [place], [place] [address]  MRSA PCR Screening     Status: None   Collection Time: 12/08/17 10:44 PM  Result Value Ref Range   MRSA by PCR NEGATIVE NEGATIVE    Comment:        The GeneXpert MRSA Assay (FDA approved for NASAL specimens only), is one component of a comprehensive MRSA colonization surveillance program. It is not intended to diagnose MRSA infection nor to guide or monitor treatment for MRSA infections. Performed at [facility], [place] [address] [place], [place] [address]   HIV antibody (Routine Testing)     Status: None   Collection Time: 12/08/17 10:57 PM  Result Value Ref Range   HIV Screen 4th Generation wRfx Non Reactive Non Reactive    Comment: (NOTE) Performed At: [facility] [place], [place] [id number] Rush Farmer MD QJ:[id number]   TSH     Status: None   Collection Time: 12/08/17 10:57 PM  Result Value Ref Range   TSH 3.400 0.350 - 4.500 uIU/mL    Comment: Performed by a 3rd Generation assay with a functional sensitivity of <=0.01 uIU/mL. Performed at [facility], [place] [address], [place], [place] [address]   Magnesium     Status:  None   Collection Time: 12/08/17 10:57 PM  Result Value Ref Range   Magnesium 1.7 1.7 - 2.4 mg/dL    Comment: Performed at [facility], [place] [address], [place], [place] [address]  Troponin I     Status: Abnormal   Collection Time: 12/08/17 10:57 PM  Result Value Ref Range   Troponin I 0.05 (HH) <0.03 ng/mL    Comment: CRITICAL RESULT CALLED TO, READ BACK BY AND VERIFIED WITH: HUBBARD,S RN 12/09/2017 0015 JORDANS Performed at [facility], [place] [address], [place], [place] [address]   Heparin level  (unfractionated)     Status: Abnormal   Collection Time: 12/08/17 10:57 PM  Result Value Ref Range   Heparin Unfractionated <0.10 (L) 0.30 - 0.70 IU/mL    Comment: (NOTE) If heparin results are below expected values, and patient dosage has  been confirmed, suggest follow up testing of antithrombin III levels. Performed at [facility], [place] [address] [place], [place] [address]   APTT     Status: Abnormal   Collection Time: 12/08/17 10:57 PM  Result Value Ref Range   aPTT 37 (H) 24 - 36 seconds    Comment:        IF BASELINE aPTT IS ELEVATED, SUGGEST PATIENT RISK ASSESSMENT BE USED TO DETERMINE APPROPRIATE ANTICOAGULANT THERAPY. Performed at [facility], [place] [address], [place], [place] [address]   Blood gas, arterial     Status: Abnormal   Collection Time: 12/08/17 11:45 PM  Result Value Ref Range   FIO2 40.00    Delivery systems BILEVEL POSITIVE AIRWAY PRESSURE    LHR 10 resp/min   Inspiratory PAP 10    Expiratory PAP 5    pH, Arterial 7.445 7.350 - 7.450   pCO2 arterial 20.0 (L) 32.0 - 48.0 mmHg   pO2, Arterial 185 (H) 83.0 - 108.0 mmHg   Bicarbonate 13.6 (L) 20.0 - 28.0 mmol/L   Acid-base deficit 9.8 (H) 0.0 - 2.0 mmol/L   O2 Saturation 99.3 %   Patient temperature 98.6    Collection site LEFT RADIAL    Drawn by [id number]    Sample type ARTERIAL DRAW    Allens test (pass/fail) PASS PASS  Basic metabolic panel     Status: Abnormal   Collection Time: 12/09/17  6:07 AM  Result Value Ref Range   Sodium 136 135 - 145 mmol/L   Potassium 4.3 3.5 - 5.1 mmol/L   Chloride 102 98 - 111 mmol/L   CO2 15 (L) 22 - 32 mmol/L   Glucose, Bld 135 (H) 70 - 99 mg/dL   BUN 43 (H) 8 - 23 mg/dL   Creatinine, Ser 3.98 (H) 0.61 - 1.24 mg/dL   Calcium 8.3 (L) 8.9 - 10.3 mg/dL   GFR calc non Af Amer 14 (L) >60 mL/min   GFR calc Af Amer 16 (L) >60 mL/min    Comment: (NOTE) The eGFR has been calculated using the CKD EPI equation. This calculation has not been validated in all  clinical situations. eGFR's persistently <60 mL/min signify possible Chronic Kidney Disease.    Anion gap 19 (H) 5 - 15    Comment: Performed at [facility], [place] [address] [place] [address]  CBC     Status: Abnormal   Collection Time: 12/09/17  6:07 AM  Result Value Ref Range   WBC 9.8 4.0 - 10.5 K/uL   RBC 3.32 (L) 4.22 - 5.81 MIL/uL  Hemoglobin 10.2 (L) 13.0 - 17.0 g/dL   HCT 30.4 (L) 39.0 - 52.0 %   MCV 91.6 78.0 - 100.0 fL   MCH 30.7 26.0 - 34.0 pg   MCHC 33.6 30.0 - 36.0 g/dL   RDW 14.9 11.5 - 15.5 %   Platelets 249 150 - 400 K/uL    Comment: Performed at [facility] [address] [place], [place] [address]  Troponin I     Status: Abnormal   Collection Time: 12/09/17  6:07 AM  Result Value Ref Range   Troponin I 0.05 (HH) <0.03 ng/mL    Comment: CRITICAL VALUE NOTED.  VALUE IS CONSISTENT WITH PREVIOUSLY REPORTED AND CALLED VALUE. Performed at [facility], [place] [address], [place], [place] [address]   Hepatic function panel     Status: Abnormal   Collection Time: 12/09/17  6:07 AM  Result Value Ref Range   Total Protein 6.5 6.5 - 8.1 g/dL   Albumin 2.1 (L) 3.5 - 5.0 g/dL   AST 111 (H) 15 - 41 U/L   ALT 37 0 - 44 U/L   Alkaline Phosphatase 67 38 - 126 U/L   Total Bilirubin 4.9 (H) 0.3 - 1.2 mg/dL   Bilirubin, Direct 2.2 (H) 0.0 - 0.2 mg/dL   Indirect Bilirubin 2.7 (H) 0.3 - 0.9 mg/dL    Comment: Performed at [facility] [address], [place], [place] [address]  Heparin level (unfractionated)     Status: None   Collection Time: 12/09/17  6:07 AM  Result Value Ref Range   Heparin Unfractionated 0.37 0.30 - 0.70 IU/mL    Comment: (NOTE) If heparin results are below expected values, and patient dosage has  been confirmed, suggest follow up testing of antithrombin III levels. Performed at [facility], [place] [address] [place], [place] [address]   Heparin level (unfractionated)     Status: None   Collection Time:  12/09/17  9:42 AM  Result Value Ref Range   Heparin Unfractionated 0.59 0.30 - 0.70 IU/mL    Comment: (NOTE) If heparin results are below expected values, and patient dosage has  been confirmed, suggest follow up testing of antithrombin III levels. Performed at [facility], [place] [address], [place], [place] [address]   APTT     Status: Abnormal   Collection Time: 12/09/17  9:42 AM  Result Value Ref Range   aPTT >200 (HH) 24 - 36 seconds    Comment:        IF BASELINE aPTT IS ELEVATED, SUGGEST PATIENT RISK ASSESSMENT BE USED TO DETERMINE APPROPRIATE ANTICOAGULANT THERAPY. REPEATED TO VERIFY CRITICAL RESULT CALLED TO, READ BACK BY AND VERIFIED WITH: PASCUAL,G RN @ [id number] 12/09/17 LEONARD,A RESULTS VERIFIED VIA RECOLLECT Performed at [facility], [address] [place], [place] [address] CORRECTED ON 09/11 AT 5284: PREVIOUSLY REPORTED AS >200        IF BASELINE aPTT IS ELEVATED, SUGGEST PATIENT RISK ASSESSMENT BE USED TO DETERMINE APPROPRIATE ANTICOAGULANT THERAPY. REPEATED TO VERIFY CRITICAL RESULT CALLED TO, READ BACK BY AND VERIFIED  WITH: No Name RN @ 1324 12/09/17 LEONARD,A   Troponin I     Status: Abnormal   Collection Time: 12/09/17  2:35 PM  Result Value Ref Range   Troponin I 0.04 (HH) <0.03 ng/mL    Comment: CRITICAL VALUE NOTED.  VALUE IS CONSISTENT WITH PREVIOUSLY REPORTED AND CALLED VALUE. Performed at [facility], [place] [address], [place], [place]  [address]   Cortisol     Status: None   Collection Time: 12/09/17  2:37 PM  Result Value Ref Range   Cortisol, Plasma 10.5 ug/dL    Comment: (NOTE) AM    6.7 - 22.6 ug/dL PM   <10.0       ug/dL Performed at [facility] [address] [place], [place] [address]   Procalcitonin - Baseline     Status: None   Collection Time: 12/09/17  2:37 PM  Result Value Ref Range   Procalcitonin 2.54 ng/mL    Comment:        Interpretation: PCT > 2 ng/mL: Systemic infection (sepsis) is likely, unless  other causes are known. (NOTE)       Sepsis PCT Algorithm           Lower Respiratory Tract                                      Infection PCT Algorithm    ----------------------------     ----------------------------         PCT < 0.25 ng/mL                PCT < 0.10 ng/mL         Strongly encourage             Strongly discourage   discontinuation of antibiotics    initiation of antibiotics    ----------------------------     -----------------------------       PCT 0.25 - 0.50 ng/mL            PCT 0.10 - 0.25 ng/mL               OR       >80% decrease in PCT            Discourage initiation of                                            antibiotics      Encourage discontinuation           of antibiotics    ----------------------------     -----------------------------         PCT >= 0.50 ng/mL              PCT 0.26 - 0.50 ng/mL               AND       <80% decrease in PCT              Encourage initiation of                                             antibiotics       Encourage continuation           of antibiotics    ----------------------------     -----------------------------        PCT >= 0.50 ng/mL                  PCT > 0.50 ng/mL               AND  increase in PCT                  Strongly encourage                                      initiation of antibiotics    Strongly encourage escalation           of antibiotics                                     -----------------------------                                           PCT <= 0.25 ng/mL                                                 OR                                        > 80% decrease in PCT                                     Discontinue / Do not initiate                                             antibiotics Performed at [facility], [address], [place], [place] [address]   CBC     Status: Abnormal   Collection Time: 12/09/17  7:52 PM  Result Value Ref Range   WBC 10.3 4.0 - 10.5 K/uL    RBC 3.24 (L) 4.22 - 5.81 MIL/uL   Hemoglobin 10.1 (L) 13.0 - 17.0 g/dL   HCT 30.1 (L) 39.0 - 52.0 %   MCV 92.9 78.0 - 100.0 fL   MCH 31.2 26.0 - 34.0 pg   MCHC 33.6 30.0 - 36.0 g/dL   RDW 15.0 11.5 - 15.5 %   Platelets 212 150 - 400 K/uL    Comment: Performed at [facility], [place] [address] [place], [place] [address]  Type and screen [place]     Status: None   Collection Time: 12/09/17  8:00 PM  Result Value Ref Range   ABO/RH(D) O POS    Antibody Screen NEG    Sample Expiration      12/12/2017 Performed at [facility], [place] [address], [place], [place] [address]   ABO/Rh     Status: None   Collection Time: 12/09/17  8:00 PM  Result Value Ref Range   ABO/RH(D)      O POS Performed at [facility] [address] [place], [place] [address]   Basic metabolic panel     Status: Abnormal   Collection Time: 12/10/17  4:26 AM  Result Value Ref  Range   Sodium 137 135 - 145 mmol/L   Potassium 3.4 (L) 3.5 - 5.1 mmol/L    Comment: DELTA CHECK NOTED   Chloride 103 98 - 111 mmol/L   CO2 18 (L) 22 - 32 mmol/L   Glucose, Bld 105 (H) 70 - 99 mg/dL   BUN 58 (H) 8 - 23 mg/dL   Creatinine, Ser 4.53 (H) 0.61 - 1.24 mg/dL   Calcium 8.1 (L) 8.9 - 10.3 mg/dL   GFR calc non Af Amer 12 (L) >60 mL/min   GFR calc Af Amer 14 (L) >60 mL/min    Comment: (NOTE) The eGFR has been calculated using the CKD EPI equation. This calculation has not been validated in all clinical situations. eGFR's persistently <60 mL/min signify possible Chronic Kidney Disease.    Anion gap 16 (H) 5 - 15    Comment: Performed at [facility], [place] [address] [place], [place] [address]  Procalcitonin     Status: None   Collection Time: 12/10/17  4:26 AM  Result Value Ref Range   Procalcitonin 2.59 ng/mL    Comment:        Interpretation: PCT > 2 ng/mL: Systemic infection (sepsis) is likely, unless other causes are known. (NOTE)       Sepsis PCT Algorithm            Lower Respiratory Tract                                      Infection PCT Algorithm    ----------------------------     ----------------------------         PCT < 0.25 ng/mL                PCT < 0.10 ng/mL         Strongly encourage             Strongly discourage   discontinuation of antibiotics    initiation of antibiotics    ----------------------------     -----------------------------       PCT 0.25 - 0.50 ng/mL            PCT 0.10 - 0.25 ng/mL               OR       >80% decrease in PCT            Discourage initiation of                                            antibiotics      Encourage discontinuation           of antibiotics    ----------------------------     -----------------------------         PCT >= 0.50 ng/mL              PCT 0.26 - 0.50 ng/mL               AND       <80% decrease in PCT              Encourage initiation of  antibiotics       Encourage continuation           of antibiotics    ----------------------------     -----------------------------        PCT >= 0.50 ng/mL                  PCT > 0.50 ng/mL               AND         increase in PCT                  Strongly encourage                                      initiation of antibiotics    Strongly encourage escalation           of antibiotics                                     -----------------------------                                           PCT <= 0.25 ng/mL                                                 OR                                        > 80% decrease in PCT                                     Discontinue / Do not initiate                                             antibiotics Performed at [facility], [address] [place], [place] [address]   Hepatic function panel     Status: Abnormal   Collection Time: 12/10/17  4:26 AM  Result Value Ref Range   Total Protein 6.2 (L) 6.5 - 8.1 g/dL   Albumin 2.1 (L) 3.5 - 5.0 g/dL    AST 80 (H) 15 - 41 U/L   ALT 35 0 - 44 U/L   Alkaline Phosphatase 55 38 - 126 U/L   Total Bilirubin 3.6 (H) 0.3 - 1.2 mg/dL   Bilirubin, Direct 1.7 (H) 0.0 - 0.2 mg/dL   Indirect Bilirubin 1.9 (H) 0.3 - 0.9 mg/dL    Comment: Performed at [facility] [address], [place] [address]  CBC     Status: Abnormal   Collection Time: 12/10/17  4:26 AM  Result Value Ref Range   WBC 10.9 (H) 4.0 - 10.5 K/uL   RBC 3.25 (L) 4.22 - 5.81 MIL/uL   Hemoglobin 10.1 (  L) 13.0 - 17.0 g/dL   HCT 29.6 (L) 39.0 - 52.0 %   MCV 91.1 78.0 - 100.0 fL   MCH 31.1 26.0 - 34.0 pg   MCHC 34.1 30.0 - 36.0 g/dL   RDW 15.0 11.5 - 15.5 %   Platelets 219 150 - 400 K/uL    Comment: Performed at [facility] [address] [place], [place] [address]     ROS:  Pertinent items are noted in HPI.  Physical Exam: Vitals:   12/10/17 0400 12/10/17 0834  BP: (!) 78/61 (!) 132/97  Pulse: 91 91  Resp: 17 17  Temp: 97.8 F (36.6 C) 98 F (36.7 C)  SpO2: 97% 96%     General: lying flat in bed on RA, drowsy, nontoxic appearing HEENT: MM dry, tongue cracked, NG tube with ~ 555m of brown fluid in suction cannister Eyes:anicteric sclera, EOMI Neck: flat JVD Heart: RRR Lungs: clear with normal WOB Abdomen: soft, nontender to palpation Extremities: no edema Skin: woody texture to skin on lower tibia, no rashes Neuro: oriented to name, not to year, location or circumstances  Assessment/Plan: 1.  Severe AKI on baseline CKD, oliguric:  Unremarkable UA on admission, FeNa day of admission 0.1%. CT A/P shows no obstruction.  Clinically he still looks hypovolemic but after 2L NS boluses this morning he now has 2038mof amber urine in bag.  I will give 1L NS bolus now and hospitalist has ordered [id number] which I agree with.  Will give PRN boluses as indicated.  FeNa may actually be helpful in this case -- her certainly could have ATN but if FeNa still low would have lower threshhold to continue to give  fluid challenges.  FeNa ordered.   He has no indications for dialysis but given the protracted course of hypotension and severity of AKI already present I think there is a high chance he will develop indications in the next 48h.  I have discussed this with he and his son.  They have poor insight but have been informed.    2.  Hypotension: agree with aggressive investigation and treatment of hypotension per primary.  Unclear etiology at this time but BP improved after 2L NS boluses so at least in part hypovolemia.    3.  Atrial fibrillation: rate controlled currenlty, agree with holding home xarelto in light of AKI.  4.  Hypokalemia: replete as needed.   5.  Anemia:  Stable now, down a bit from admission.  No indications for ESA currently.   Call with questions/concerns.  Will follow closely.   KruJustin Mend12/2019, 1:04 PM   [facility] [employer] Pager [phone]

## 2017-12-10 NOTE — Progress Notes (Signed)
PROGRESS NOTE                                                                                                                                                                                                             Patient Demographics:    Tanner Gilbert, is a 67 y.o. male, DOB - 08-21-1950, MVH:[id number]  Admit date - 12/08/2017   Admitting Physician Eduard Clos, MD  Outpatient Primary MD for the patient is Copland, Gwenlyn Found, MD  LOS - 2   Chief Complaint  Patient presents with  . Shortness of Breath       Brief Narrative    67 y.o. male with history of atrial fibrillation, chronic systolic heart failure last EF measured in 2017 was 40 to 45%, hypertension was brought to the ER because of shortness of breath, initial suspicion was for acute on chronic systolic CH, PE ruled out with VQ scan, as well he was noted to have acute on chronic renal failure, hypotensive, with projectile vomiting.   Subjective:    Tanner Gilbert today no further nausea or vomiting after NGT insertion, patient reports multiple bowel movements yesterday, he denies any complaints today .   Assessment  & Plan :    Principal Problem:   Acute respiratory failure with hypoxia (HCC) Active Problems:   HTN (hypertension) with goal to be determined   Acute on chronic systolic heart failure (HCC)   Persistent atrial fibrillation (HCC)   ARF (acute renal failure) (HCC)   Nausea and vomiting   Hypotension   SIRS (systemic inflammatory response syndrome) (HCC)   SIRS -Patient presents with tachycardia, hypotension, ptosis, as well having elevated procalcitonin, -Infectious etiology could be found so far on CT abdomen pelvis, chest x-ray with no pneumonia, negative urine analysis, but she does have elevated procalcitonin, follow on blood cultures, can continue empirically with IV Zosyn. -Trend lactic acid. -Patient remains with significant hypotension, continue  with fluid boluses as he remains with low urine output and hypovolemic on physical exam, will hold on pressors currently until volume is repleted. -Continue with stress dose hydrocortisone  Acute renal failure on CKD -Most likely in the setting of ATN, from volume depletion/hypotension, with Lasix use at home, improvement despite aggressive fluid resuscitation, no evidence of urinary retention or hydronephrosis on imaging, Foley catheter inserted, continue with IV fluid,  renal consulted.  Psoas hematoma -Hemoglobin overall stable, dropped slightly secondary to fluid resuscitation, anti-coagulation for now  Acute respiratory failure with hypoxia  -Thought to be secondary to acute on chronic systolic CHF, EF 40 to 45% in [id number], initially requiring BiPAP, significantly improved with IV diuresis . -Currently on 2 L nasal cannula with no evidence of volume overload after diuresis. -VQ scan negative for PE . -Continue to hold BiDil, Coreg and Lasix in the setting of hypotension  Acute on chronic systolic CHF -Felt to be volume overloaded on admission, was on IV diuresis, last echo 2017, echo is pending, appears to be dry currently, following repeat echo  History of atrial fibrillation  -Titrate elevated on admission, improved with Cardizem bolus, currently hypotensive, will hold Cardizem and Coreg as well . -He is on Xarelto at home, on hold given worsening renal function, heparin drip, currently on hold, especially in the setting of psoas hematoma.  Elevated LFTs -He has benign abdominal exam, some nonspecific finding on right upper quadrant ultrasound, gallbladder unremarkable on ultrasound, trending down, continue to monitor.  Nausea and vomiting -Patient with multiple projectile vomiting yesterday, x-ray initially suspicious for gastric outlet obstruction, but none could be found on CT abdomen pelvis, having good bowel movements, continue with NGT suction today, will clamp tomorrow.   Code  Status : Full  Family Communication  : Son at bedside  Disposition Plan  : remains in stepdown  Consults  :  none  Procedures  : none  DVT Prophylaxis  :  SCD  Lab Results  Component Value Date   PLT 219 12/10/2017    Antibiotics  :    Anti-infectives (From admission, onward)   Start     Dose/Rate Route Frequency Ordered Stop   12/09/17 1800  piperacillin-tazobactam (ZOSYN) IVPB 3.375 g  Status:  Discontinued     3.375 g 12.5 mL/hr over 240 Minutes Intravenous Every 8 hours 12/09/17 1648 12/09/17 1736   12/09/17 1737  piperacillin-tazobactam (ZOSYN) IVPB 2.25 g     2.25 g 100 mL/hr over 30 Minutes Intravenous Every 8 hours 12/09/17 1736          Objective:   Vitals:   12/09/17 1800 12/09/17 1937 12/10/17 0400 12/10/17 0834  BP: (!) 83/61 (!) 79/61 (!) 78/61 (!) 132/97  Pulse: 98 74 91 91  Resp: (!) 27 (!) 29 17 17   Temp:  98.1 F (36.7 C) 97.8 F (36.6 C) 98 F (36.7 C)  TempSrc:  Oral Oral Axillary  SpO2: 100% 98% 97% 96%  Weight:      Height:        Wt Readings from Last 3 Encounters:  12/08/17 91 kg  10/10/15 134.1 kg  10/08/15 135.4 kg     Intake/Output Summary (Last 24 hours) at 12/10/2017 1114 Last data filed at 12/09/2017 1500 Gross per 24 hour  Intake 883.61 ml  Output -  Net 883.61 ml     Physical Exam  Awake Alert, Oriented X 3, No new F.N deficits, Normal affect Symmetrical Chest wall movement, Good air movement bilaterally, CTAB IRR IRR ,No Gallops,Rubs or new Murmurs, No Parasternal Heave +ve B.Sounds, Abd Soft, No tenderness, No rebound - guarding or rigidity. No Cyanosis, Clubbing or edema, No new Rash or bruise      Data Review:    CBC Recent Labs  Lab 12/08/17 1913 12/09/17 0607 12/09/17 1952 12/10/17 0426  WBC 14.7* 9.8 10.3 10.9*  HGB 12.4* 10.2* 10.1* 10.1*  HCT 37.3* 30.4* 30.1*  29.6*  PLT 368 249 212 219  MCV 94.2 91.6 92.9 91.1  MCH 31.3 30.7 31.2 31.1  MCHC 33.2 33.6 33.6 34.1  RDW 15.2 14.9 15.0 15.0     Chemistries  Recent Labs  Lab 12/08/17 1913 12/08/17 2257 12/09/17 0607 12/10/17 0426  NA 135  --  136 137  K 3.7  --  4.3 3.4*  CL 102  --  102 103  CO2 10*  --  15* 18*  GLUCOSE 121*  --  135* 105*  BUN 35*  --  43* 58*  CREATININE 3.44*  --  3.98* 4.53*  CALCIUM 8.9  --  8.3* 8.1*  MG  --  1.7  --   --   AST  --   --  111* 80*  ALT  --   --  37 35  ALKPHOS  --   --  67 55  BILITOT  --   --  4.9* 3.6*   ------------------------------------------------------------------------------------------------------------------ No results for input(s): CHOL, HDL, LDLCALC, TRIG, CHOLHDL, LDLDIRECT in the last 72 hours.  Lab Results  Component Value Date   HGBA1C 5.9 10/08/2015   ------------------------------------------------------------------------------------------------------------------ Recent Labs    12/08/17 2257  TSH 3.400   ------------------------------------------------------------------------------------------------------------------ No results for input(s): VITAMINB12, FOLATE, FERRITIN, TIBC, IRON, RETICCTPCT in the last 72 hours.  Coagulation profile No results for input(s): INR, PROTIME in the last 168 hours.  Recent Labs    12/08/17 1938  DDIMER 1.39*    Cardiac Enzymes Recent Labs  Lab 12/08/17 2257 12/09/17 0607 12/09/17 1435  TROPONINI 0.05* 0.05* 0.04*   ------------------------------------------------------------------------------------------------------------------    Component Value Date/Time   BNP 976.0 (H) 12/08/2017 1913   BNP 336.8 (H) 08/21/2014 1624    Inpatient Medications  Scheduled Meds: . hydrocortisone sod succinate (SOLU-CORTEF) inj  100 mg Intravenous Q8H   Continuous Infusions: . sodium chloride 150 mL/hr at 12/10/17 0836  . phenylephrine (NEO-SYNEPHRINE) Adult infusion    . piperacillin-tazobactam (ZOSYN)  IV 2.25 g (12/09/17 1905)  . sodium chloride 1,000 mL (12/10/17 1023)  . sodium chloride     PRN  Meds:.acetaminophen **OR** acetaminophen, ondansetron **OR** ondansetron (ZOFRAN) IV  Micro Results Recent Results (from the past 240 hour(s))  MRSA PCR Screening     Status: None   Collection Time: 12/08/17 10:44 PM  Result Value Ref Range Status   MRSA by PCR NEGATIVE NEGATIVE Final    Comment:        The GeneXpert MRSA Assay (FDA approved for NASAL specimens only), is one component of a comprehensive MRSA colonization surveillance program. It is not intended to diagnose MRSA infection nor to guide or monitor treatment for MRSA infections. Performed at [facility], [address], [place], [place] [address]     Radiology Reports Ct Abdomen Pelvis Wo Contrast  Result Date: 12/09/2017 CLINICAL DATA:  Nausea, projectile vomiting, gastric outlet obstruction question by radiographs, excessive diarrhea, hypertension, CHF, atrial fibrillation EXAM: CT ABDOMEN AND PELVIS WITHOUT CONTRAST TECHNIQUE: Multidetector CT imaging of the abdomen and pelvis was performed following the standard protocol without IV contrast. Sagittal and coronal MPR images reconstructed from axial data set. GI contrast administered. Scattered respiratory motion artifacts are present. COMPARISON:  None FINDINGS: Lower chest: Subsegmental atelectasis RIGHT lower lobe. LEFT lung base clear. Hepatobiliary: Marked fatty infiltration of liver. Gallbladder unremarkable. Pancreas: Normal appearance Spleen: Normal appearance Adrenals/Urinary Tract: Adrenal glands, kidneys, ureters, and bladder grossly normal appearance within the limitations imposed by motion artifacts Stomach/Bowel: Stool in rectum. Large  and small bowel loops unremarkable. Nasogastric tube in stomach. Contrast and air within stomach without definite evidence of gastric mass or outlet obstruction. Vascular/Lymphatic: Atherosclerotic calcifications aorta and iliac arteries as well as coronary arteries, including LEFT main coronary artery. Aorta normal caliber.  No adenopathy. Reproductive: Unremarkable Other: Umbilical hernia containing fat. No definite free air or free fluid. Musculoskeletal: Diffuse osseous demineralization. No acute bony findings. Multilevel degenerative disc disease changes lower lumbar spine. Asymmetric enlargement and increased attenuation of the LEFT psoas muscle versus RIGHT compatible with intramuscular hematoma. IMPRESSION: Intramuscular hematoma of the LEFT psoas muscle which is asymmetrically enlarged and hyperdense versus RIGHT. Fatty infiltration of liver. No definite evidence of gastric outlet obstruction or gastric mass. Umbilical hernia containing fat. Scattered atherosclerotic calcifications of aorta, iliac arteries and coronary arteries including LEFT main coronary artery. Electronically Signed   By: Ulyses Southward M.D.   On: 12/09/2017 23:34   Nm Pulmonary Perf And Vent  Result Date: 12/09/2017 CLINICAL DATA:  Shortness of breath EXAM: NUCLEAR MEDICINE VENTILATION - PERFUSION LUNG SCAN VIEWS: Anterior, posterior, LPO, RPO, LAO, RAO-ventilation and perfusion RADIOPHARMACEUTICALS:  31.2 mCi of Tc-59m DTPA aerosol inhalation and 4.2 mCi Tc47m-MAA IV COMPARISON:  Chest radiograph December 08, 2017 FINDINGS: Ventilation: Radiotracer uptake on the ventilation study is homogeneous and symmetric bilaterally. No ventilation defects are evident. There is elevation of the right hemidiaphragm, noted on chest radiograph. Perfusion: Radiotracer uptake is homogeneous and symmetric bilaterally. No perfusion defects are evident. There is elevation of the right hemidiaphragm. IMPRESSION: No appreciable ventilation or perfusion defect. This study constitutes a very low probability of pulmonary embolus. There is elevation of the right hemidiaphragm. Electronically Signed   By: Bretta Bang III M.D.   On: 12/09/2017 10:05   Dg Chest Portable 1 View  Result Date: 12/08/2017 CLINICAL DATA:  Short of breath and weakness since 630 today EXAM:  PORTABLE CHEST 1 VIEW COMPARISON:  09/08/2015 FINDINGS: Mild cardiomegaly. Normal vascularity. Right hemidiaphragm remains elevated. Bibasilar atelectasis. IMPRESSION: Cardiomegaly without decompensation. Bibasilar atelectasis. Electronically Signed   By: Jolaine Click M.D.   On: 12/08/2017 19:43   Dg Abd Portable 1v  Result Date: 12/09/2017 CLINICAL DATA:  Nausea and vomiting EXAM: PORTABLE ABDOMEN - 1 VIEW COMPARISON:  Abdominal ultrasound of December 09, 2017 FINDINGS: The stomach is moderately distended with air. No small or large bowel obstruction is observed. The stool burden does not appear excessive. There are no abnormal soft tissue calcifications. There are degenerative changes at the L3-4 and L4-5 disc levels. IMPRESSION: Moderate gaseous distention of the stomach may reflect gastric outlet obstruction. No evidence of a small or large bowel obstructive process. Electronically Signed   By: David  [place] M.D.   On: 12/09/2017 15:17   US Abdomen Limited Ruq  Result Date: 12/09/2017 CLINICAL DATA:  Hyperbilirubinemia EXAM: ULTRASOUND ABDOMEN LIMITED RIGHT UPPER QUADRANT COMPARISON:  None. FINDINGS: Gallbladder: Poor visualization of the gallbladder which appears to be contracted and mildly thick-walled at 3 mm. Orientation of the gallbladder and patient body habitus made assessment problematic. Small nonshadowing filling defects in the gallbladder for example on image 5 potentially from tumefactive sludge. This was not interrogated by Doppler to assess for solid mass. Strictly speaking a gallbladder polyp could have a similar appearance for example on image 8. Sonographic Eulah Pont sign was absent.  No definite shadowing calculi. Common bile duct: Diameter: 4 mm Liver: Coarse echogenic liver with poor sonic penetration compatible with diffuse hepatic steatosis. Portal vein is patent on color Doppler imaging with  normal direction of blood flow towards the liver. IMPRESSION: 1. Technically difficult exam  due to orientation of the gallbladder and the patient's body habitus. Also the gallbladder appears somewhat contracted and mildly thick-walled, although sonographic Murphy sign was absent. There is some complex rounded filling defect in the gallbladder which is probably tumefactive sludge, along with several small nonshadowing intraluminal hyperechogenicies. The possibility of a polyp or small gallbladder mass is not excluded on today's exam. Given the limited liver enzymes and constellation of findings, consider MRI/MRCP with and without contrast for further characterization. 2. Coarse echogenic liver with poor sonic penetration compatible with diffuse hepatic steatosis. Electronically Signed   By: Gaylyn Rong M.D.   On: 12/09/2017 12:50     Huey Bienenstock M.D on 12/10/2017 at 11:14 AM  Between 7am to 7pm - Pager - [phone]  After 7pm go to [email or web address] - password [facility]  [facility] -  Office  [phone]

## 2017-12-10 NOTE — Consult Note (Addendum)
Tanner Gilbert  ZOX:[id number] DOB: December 23, 1950 DOA: 12/08/2017 PCP: Pearline Cables, MD    LOS: 2 days   Reason for Consult / Chief Complaint:  Hypotension   Consulting MD and date:  [facility]) 9/12>>>  HPI/Summary of hospital stay:  67 year old male with a history of A. fib status post cardioversion with reversal back to A. fib, history of HTN, diastolic heart failure/10 with generalized complaints including weakness, emesis.  He was hypoxic on EMS arrival and placed on CPAP for transport.  In ER he was found to be in A. fib with RVR, AKI with creatinine 3.44, WBC 14.7.  D-dimer was positive but VQ scan showed low probability for PE.  Troponins were flat, BNP 976.  He was admitted to the hospitalist service with A. fib with RVR and initial concern for pulmonary edema.  Ongoing work-up less concerning for volume overload/CHF with negative chest x-ray, no JVD, no edema.  He has however had ongoing hypotension and PCCM consulted 9/12.  Subjective:  He is currently getting echocardiogram Denies shortness of breath, chest pain Does still feel a bit nauseous but no abdominal pain.  Has NG tube with large amount of dark brown drainage in canister.  Objective   Blood pressure (!) 132/97, pulse 91, temperature 98 F (36.7 C), temperature source Axillary, resp. rate 17, height 6' (1.829 m), weight 91 kg, SpO2 96 %.        Intake/Output Summary (Last 24 hours) at 12/10/2017 1449 Last data filed at 12/09/2017 1500 Gross per 24 hour  Intake 75 ml  Output -  Net 75 ml   Filed Weights   12/08/17 2220  Weight: 91 kg    Examination: General: Chronically ill-appearing male, no acute distress HENT: Mucous membranes are moist, no JVD, NG tube in place Lungs: Respirations are even and nonlabored on nasal cannula, essentially clear Cardiovascular: S1-S2 irregular Abdomen: Soft, nontender, positive bowel sounds, benign Extremities: Warm and dry, no significant edema Neuro: Awake,  alert, mostly appropriate  Consults: date of consult/date signed off & final recs:   Cardiology 9/11>>>  Procedures:   Significant Diagnostic Tests: CT abd/pelvis 9/11>>> Intramuscular hematoma of the LEFT psoas muscle which is asymmetrically enlarged and hyperdense versus RIGHT. Fatty infiltration of liver. No definite evidence of gastric outlet obstruction or gastric mass. Umbilical hernia containing fat. Scattered atherosclerotic calcifications of aorta, iliac arteries and coronary arteries including LEFT main coronary artery.  Micro Data: BC x 2 9/12>>>  Antimicrobials:  Zosyn 9/12>>>  Resolved Hospital Problem list    Assessment & Plan:  Hypotension  - Suspect multifactorial in the setting of dehydration related to emesis and weakness as well as A. fib with RVR (which is now improved) and left psoas intramuscular hematoma without significant anemia.  BNP elevated initially but patient overall seems somewhat dry.  Mild AKI. Plan- Check lactate now and every 3 hours Trend procalcitonin Monitor abdominal exam Trend CBC Would give gentle volume-1 L normal saline ordered Follow-up chemistry Continue empiric antibiotics for?  Intra-abdominal source although exam is benign  A. fib with RVR Plan- Cardiology following Heart rate control Echo pending Trend troponin Holding anticoagulation  AKI Plan- Gentle volume as above Follow-up chemistry Monitor urine output  Psoas intramuscular hematoma  Plan- Holding anticoagulation as above Trend CBC  Disposition / Summary of Today's Plan 12/10/17   Gentle volume Monitor CBC Trend lactate, procalcitonin Continue empiric antibiotics    DVT prophylaxis: SCD GI prophylaxis: Not indicated Diet: N.p.o. Mobility: As tolerated  Code Status: Full Family Communication: Family at bedside 9/12, per primary  Labs   CBC: Recent Labs  Lab 12/08/17 1913 12/09/17 0607 12/09/17 1952 12/10/17 0426  WBC 14.7* 9.8 10.3  10.9*  HGB 12.4* 10.2* 10.1* 10.1*  HCT 37.3* 30.4* 30.1* 29.6*  MCV 94.2 91.6 92.9 91.1  PLT 368 249 212 219   Basic Metabolic Panel: Recent Labs  Lab 12/08/17 1913 12/08/17 2257 12/09/17 0607 12/10/17 0426  NA 135  --  136 137  K 3.7  --  4.3 3.4*  CL 102  --  102 103  CO2 10*  --  15* 18*  GLUCOSE 121*  --  135* 105*  BUN 35*  --  43* 58*  CREATININE 3.44*  --  3.98* 4.53*  CALCIUM 8.9  --  8.3* 8.1*  MG  --  1.7  --   --    GFR: Estimated Creatinine Clearance: 17.1 mL/min (A) (by C-G formula based on SCr of 4.53 mg/dL (H)). Recent Labs  Lab 12/08/17 1913 12/09/17 0607 12/09/17 1437 12/09/17 1952 12/10/17 0426 12/10/17 1218  PROCALCITON  --   --  2.54  --  2.59  --   WBC 14.7* 9.8  --  10.3 10.9*  --   LATICACIDVEN  --   --   --   --   --  1.5   Liver Function Tests: Recent Labs  Lab 12/09/17 0607 12/10/17 0426  AST 111* 80*  ALT 37 35  ALKPHOS 67 55  BILITOT 4.9* 3.6*  PROT 6.5 6.2*  ALBUMIN 2.1* 2.1*   No results for input(s): LIPASE, AMYLASE in the last 168 hours. No results for input(s): AMMONIA in the last 168 hours. ABG    Component Value Date/Time   PHART 7.445 12/08/2017 2345   PCO2ART 20.0 (L) 12/08/2017 2345   PO2ART 185 (H) 12/08/2017 2345   HCO3 13.6 (L) 12/08/2017 2345   TCO2 26 09/05/2010 2031   ACIDBASEDEF 9.8 (H) 12/08/2017 2345   O2SAT 99.3 12/08/2017 2345    Coagulation Profile: No results for input(s): INR, PROTIME in the last 168 hours. Cardiac Enzymes: Recent Labs  Lab 12/08/17 2257 12/09/17 0607 12/09/17 1435  TROPONINI 0.05* 0.05* 0.04*   HbA1C: Hgb A1c MFr Bld  Date/Time Value Ref Range Status  10/08/2015 11:57 AM 5.9 4.6 - 6.5 % Final    Comment:    Glycemic Control Guidelines for People with Diabetes:Non Diabetic:  <6%Goal of Therapy: <7%Additional Action Suggested:  >8%   08/21/2014 02:21 PM 5.8 (H) <5.7 % Final    Comment:                                                                           According  to the ADA Clinical Practice Recommendations for 2011, when HbA1c is used as a screening test:     >=6.5%   Diagnostic of Diabetes Mellitus            (if abnormal result is confirmed)   5.7-6.4%   Increased risk of developing Diabetes Mellitus   References:Diagnosis and Classification of Diabetes Mellitus,Diabetes Care,2011,34(Suppl 1):S62-S69 and Standards of Medical Care in         Diabetes - 2011,Diabetes Care,2011,34 (Suppl 1):S11-S61.  CBG: No results for input(s): GLUCAP in the last 168 hours.   Review of Systems:    Past medical history  He,  has a past medical history of Atrial fibrillation (HCC), CHF (congestive heart failure) (HCC), Hypertension, and Obesity.   Surgical History    Past Surgical History:  Procedure Laterality Date  . CARDIOVERSION N/A 10/15/2015   Procedure: CARDIOVERSION;  Surgeon: Quintella Reichert, MD;  Location: [facility];  Service: Cardiovascular;  Laterality: N/A;     Social History   Social History   Socioeconomic History  . Marital status: Single    Spouse name: Not on file  . Number of children: Not on file  . Years of education: Not on file  . Highest education level: Not on file  Occupational History  . Not on file  Social Needs  . Financial resource strain: Not on file  . Food insecurity:    Worry: Not on file    Inability: Not on file  . Transportation needs:    Medical: Not on file    Non-medical: Not on file  Tobacco Use  . Smoking status: Never Smoker  . Smokeless tobacco: Never Used  Substance and Sexual Activity  . Alcohol use: No  . Drug use: No  . Sexual activity: Yes  Lifestyle  . Physical activity:    Days per week: Not on file    Minutes per session: Not on file  . Stress: Not on file  Relationships  . Social connections:    Talks on phone: Not on file    Gets together: Not on file    Attends religious service: Not on file    Active member of club or organization: Not on file    Attends meetings  of clubs or organizations: Not on file    Relationship status: Not on file  . Intimate partner violence:    Fear of current or ex partner: Not on file    Emotionally abused: Not on file    Physically abused: Not on file    Forced sexual activity: Not on file  Other Topics Concern  . Not on file  Social History Narrative  . Not on file  ,  reports that he has never smoked. He has never used smokeless tobacco. He reports that he does not drink alcohol or use drugs.   Family history   His Family history is unknown by patient.   Allergies Allergies  Allergen Reactions  . Apixaban Other (See Comments)    Stiffness of neck and head  . Lisinopril Swelling    Swelling- angioedema    Home meds  Prior to Admission medications   Medication Sig Start Date End Date Taking? Authorizing Provider  carvedilol (COREG) 25 MG tablet Take 1 tablet (25 mg total) by mouth 2 (two) times daily with a meal. 07/14/17  Yes Copland, Gwenlyn Found, MD  furosemide (LASIX) 40 MG tablet Take 1 tablet (40 mg total) by mouth daily. 07/14/17  Yes Copland, Gwenlyn Found, MD  isosorbide-hydrALAZINE (BIDIL) 20-37.5 MG tablet Take 1 tablet by mouth 3 (three) times daily. 05/07/16  Yes Copland, Gwenlyn Found, MD  Potassium Chloride ER 20 MEQ TBCR Take 20 mEq by mouth daily. 12/19/16  Yes Copland, Gwenlyn Found, MD  rivaroxaban (XARELTO) 20 MG TABS tablet TAKE 1 TABLET DAILY WITH SUPPER 07/14/17  Yes Copland, Gwenlyn Found, MD  BIDIL 20-37.5 MG tablet TAKE 1 TABLET THREE TIMES A DAY Patient not taking: Reported on 12/08/2017 12/12/16  Copland, Gwenlyn FoundJessica C, MD     Dirk DressKaty Whiteheart, NP 12/10/2017  2:49 PM Pager: [phone] or [phone]) [phone]  Attending Note:  67 year old male with a complex PMH who presents to PCCM with dehydration, circulatory shock, N/V and acute renal failure.  On exam, lungs are clear, no edema noted.  I reviewed CXR myself, no pulmonary edema noted.  Patient is physically dry on exam and tachycardic.  Responds some to IVF  but then BP drops again.  Fluid challenges have been somewhat limited thus far.  Patient was given a full liter of IVF with apparent improvement in BP, currently in the 130's systolic with adequate mental status but UOP is negligible.  Discussed with PCCM-NP and TRH-MD.  Continue fluid resuscitation.  If out of NSR and back in A-fib then will likely need higher level of care.  Continue hydration, no need for pressors at this time as patient remains fluid responsive.    PCCM will be available PRN.  The patient is critically ill with multiple organ systems failure and requires high complexity decision making for assessment and support, frequent evaluation and titration of therapies, application of advanced monitoring technologies and extensive interpretation of multiple databases.   Critical Care Time devoted to patient care services described in this note is  36  Minutes. This time reflects time of care of this signee Dr Koren BoundWesam Yacoub. This critical care time does not reflect procedure time, or teaching time or supervisory time of PA/NP/Med student/Med Resident etc but could involve care discussion time.  Alyson ReedyWesam G. Yacoub, M.D. [facility]. Pager: [phone]. After hours pager: [phone].

## 2017-12-10 NOTE — Progress Notes (Signed)
Brief cardiology follow up note  Patient had echo today: Study Conclusions - Left ventricle: The cavity size was normal. Wall thickness was   increased in a pattern of mild LVH. Systolic function was normal.   The estimated ejection fraction was in the range of 60% to 65%.   Wall motion was normal; there were no regional wall motion   abnormalities. - Aortic valve: There was mild regurgitation. Valve area (VTI):   2.46 cm^2. Valve area (Vmean): 2.59 cm^2. - Left atrium: The atrium was mildly dilated. - Right atrium: The atrium was mildly dilated.  On my read, I think that the tachycardia he was experiencing at the time is overestimating his actual ejection fraction/function slightly, but I do not see any new significant abnormalities that would link his current presentation to a cardiac cause. We cannot estimate right sided pressures with his trivial TR jet, but he does not appear to have significant volume overload on his echo.  This does not appear consistent with a heart failure exacerbation. We will sign off at this time, but if we can be of assistance please do not hesitate to contact us.  [facility] will sign off.   Medication Recommendations: hold all heart failure/hypertension meds while hypotension. Once he improves, would gradually restart home meds. He was on an appropriate heart failure regimen prior to admission. If renal function does not improve, would consider another option for anticoagulation besides rivaroxaban. Other recommendations (labs, testing, etc):  none Follow up as an outpatient:  The son could not tell me who follows his heart issues. He thinks it might be through the [employer] [facility]. If he has an established cardiologist, recommend returning to their care. Otherwise we would be happy to set him up with an appointment with our group.  Jodelle RedBridgette Natalya Domzalski, MD, PhD [facility]  [facility]  [address], [address] [place], [place] [address] [phone]  [phone]

## 2017-12-10 NOTE — Consult Note (Signed)
[place] Primary Care Navigator  12/10/2017  Desmund Elman April 13, 1950 [id number]    Met withpatientand son Moore, Brooke Bonito.) at the bedsideto identify possible discharge needs. Son reports that patient was having "pain and stiffness to lower back making it difficult to walk and was also feeling very warm and hot". Patient was found being short of breath and admitted for acute respiratory failure likely from congestive HF.  PatientendorsesDr.Jessica Copland with Therapist, music at [employer] care provider.   [facility] [address] and patient mentioned also using [facility] service to obtain medications without any problem.   Sonstatesthatpatient has been managinghismedications at [employer] his assistance and use of "pill box" system filled weekly.  Patient has been driving prior to admission but son will  be providing transportation tohisdoctors' appointmentsafter discharge.  Son liveswithpatient and will serve as hisprimary caregiverat home.   Anticipated discharge plan ishomeper patient and son.  Patient and sonvoiced understanding to call primary care provider's office whenhereturns home for a post discharge follow-up appointment within1- 2 weeksor sooner if needs arise.Patient letter (with PCP's contact number) was provided asa reminder.  Explained topatientand son regarding[facility] CM services available for health managementand resourcesat home and had expressed interest about it. Per chart review, patient was felt to be volume overloaded on admission and was put on IV diuresis, then was noted to have acute on chronic renal failure and hypotensive with projectile vomiting.  Patient mentioned having a weighing scale at home and monitoring/ recording daily weights. Son reports that patient exercises, tries to follow low sodium diet/ low fat diet, takes  medications as ordered and follow-up with provider when needed.   Patienthad optedand verbally agreedwithEMMI HF callsto follow-up with hisrecovery at home.  Referral was made for EMMI HFcalls after discharge.  Patientand son expressed understanding to seekref[facility] to [facility] care managementfrom primary care provider if deemed necessary forfurther servicesin the future.   [facility] care management information provided for future needs that may arise.   For additional questions please contact:  Edwena Felty A. Amica Harron, BSN, RN-BC [facility] PRIMARY CARE Navigator Cell: [phone]

## 2017-12-10 NOTE — Progress Notes (Signed)
  Echocardiogram 2D Echocardiogram has been performed.  [name]  [name] 12/10/2017, 10:58 AM

## 2017-12-11 LAB — IRON AND TIBC
Iron: 66 ug/dL (ref 45–182)
Saturation Ratios: 41 % — ABNORMAL HIGH (ref 17.9–39.5)
TIBC: 162 ug/dL — ABNORMAL LOW (ref 250–450)
UIBC: 96 ug/dL

## 2017-12-11 LAB — COMPREHENSIVE METABOLIC PANEL
ALBUMIN: 2.1 g/dL — AB (ref 3.5–5.0)
ALK PHOS: 57 U/L (ref 38–126)
ALT: 33 U/L (ref 0–44)
AST: 61 U/L — ABNORMAL HIGH (ref 15–41)
Anion gap: 14 (ref 5–15)
BILIRUBIN TOTAL: 3.6 mg/dL — AB (ref 0.3–1.2)
BUN: 55 mg/dL — ABNORMAL HIGH (ref 8–23)
CALCIUM: 7.6 mg/dL — AB (ref 8.9–10.3)
CO2: 16 mmol/L — AB (ref 22–32)
CREATININE: 3.54 mg/dL — AB (ref 0.61–1.24)
Chloride: 111 mmol/L (ref 98–111)
GFR calc Af Amer: 19 mL/min — ABNORMAL LOW (ref >60)
GFR calc non Af Amer: 16 mL/min — ABNORMAL LOW (ref >60)
GLUCOSE: 117 mg/dL — AB (ref 70–99)
Potassium: 2.9 mmol/L — ABNORMAL LOW (ref 3.5–5.1)
SODIUM: 141 mmol/L (ref 135–145)
Total Protein: 5.9 g/dL — ABNORMAL LOW (ref 6.5–8.1)

## 2017-12-11 LAB — CK TOTAL AND CKMB (NOT AT ARMC)
CK, MB: 0.8 ng/mL (ref 0.5–5.0)
Relative Index: INVALID (ref 0.0–2.5)
Total CK: 70 U/L (ref 49–397)

## 2017-12-11 LAB — BASIC METABOLIC PANEL
Anion gap: 13 (ref 5–15)
BUN: 50 mg/dL — AB (ref 8–23)
CO2: 18 mmol/L — ABNORMAL LOW (ref 22–32)
CREATININE: 2.97 mg/dL — AB (ref 0.61–1.24)
Calcium: 7.7 mg/dL — ABNORMAL LOW (ref 8.9–10.3)
Chloride: 113 mmol/L — ABNORMAL HIGH (ref 98–111)
GFR calc Af Amer: 23 mL/min — ABNORMAL LOW (ref >60)
GFR, EST NON AFRICAN AMERICAN: 20 mL/min — AB (ref >60)
GLUCOSE: 119 mg/dL — AB (ref 70–99)
Potassium: 3.5 mmol/L (ref 3.5–5.1)
SODIUM: 144 mmol/L (ref 135–145)

## 2017-12-11 LAB — RETICULOCYTES
RBC.: 2.82 MIL/uL — ABNORMAL LOW (ref 4.22–5.81)
RETIC COUNT ABSOLUTE: 56.4 10*3/uL (ref 19.0–186.0)
RETIC CT PCT: 2 % (ref 0.4–3.1)

## 2017-12-11 LAB — CBC
HEMATOCRIT: 26 % — AB (ref 39.0–52.0)
HEMOGLOBIN: 8.7 g/dL — AB (ref 13.0–17.0)
MCH: 31.2 pg (ref 26.0–34.0)
MCHC: 33.5 g/dL (ref 30.0–36.0)
MCV: 93.2 fL (ref 78.0–100.0)
Platelets: 187 10*3/uL (ref 150–400)
RBC: 2.79 MIL/uL — ABNORMAL LOW (ref 4.22–5.81)
RDW: 15.2 % (ref 11.5–15.5)
WBC: 7.4 10*3/uL (ref 4.0–10.5)

## 2017-12-11 LAB — PROCALCITONIN: PROCALCITONIN: 1.1 ng/mL

## 2017-12-11 LAB — VITAMIN B12: Vitamin B-12: 934 pg/mL — ABNORMAL HIGH (ref 180–914)

## 2017-12-11 LAB — FERRITIN: FERRITIN: 1002 ng/mL — AB (ref 24–336)

## 2017-12-11 LAB — FOLATE: Folate: 6.9 ng/mL (ref >5.9)

## 2017-12-11 MED ORDER — HYDROCORTISONE NA SUCCINATE PF 100 MG IJ SOLR
50.0000 mg | Freq: Three times a day (TID) | INTRAMUSCULAR | Status: DC
  Administered 2017-12-11 – 2017-12-12 (×4): 50 mg via INTRAVENOUS
  Filled 2017-12-11 (×4): qty 2

## 2017-12-11 MED ORDER — HEPARIN SODIUM (PORCINE) 5000 UNIT/ML IJ SOLN
5000.0000 [IU] | Freq: Three times a day (TID) | INTRAMUSCULAR | Status: DC
  Administered 2017-12-11 – 2017-12-15 (×11): 5000 [IU] via SUBCUTANEOUS
  Filled 2017-12-11 (×11): qty 1

## 2017-12-11 MED ORDER — POTASSIUM CHLORIDE 10 MEQ/100ML IV SOLN
10.0000 meq | INTRAVENOUS | Status: AC
  Administered 2017-12-11 (×6): 10 meq via INTRAVENOUS
  Filled 2017-12-11 (×6): qty 100

## 2017-12-11 MED ORDER — STERILE WATER FOR INJECTION IV SOLN
INTRAVENOUS | Status: DC
  Administered 2017-12-11 – 2017-12-12 (×2): via INTRAVENOUS
  Filled 2017-12-11 (×4): qty 850

## 2017-12-11 NOTE — Evaluation (Signed)
Physical Therapy Evaluation Patient Details Name: Tanner Gilbert MRN: [id number] DOB: 1951-03-29 Today's Date: 12/11/2017   History of Present Illness  67 year old male with A. fib, CHF, hypertension admitted with dyspnea, dehydration and sepsis of unclear source. Pt found to have acute kidney injury and hyperkalemia.   Clinical Impression  PTA pt reports ambulating independently although son reports difficulty getting up and down stairs. Pt also reports being able to bath and dress himself. Son reports checking on him frequently when he bathes but not helping him. Pt is currently limited in safe mobility by decreased cognition, decreased strength and decreased balance. Pt currently requires maxA to come to/from EoB and minA to maintain balance in sitting. PT recommends SNF level rehab at d/c to work on strength and balance to safely return to his home. PT will continue to follow acutely.     Follow Up Recommendations SNF    Equipment Recommendations  Other (comment)(TBD at next venue)    Recommendations for Other Services OT consult     Precautions / Restrictions Precautions Precautions: Fall Restrictions Weight Bearing Restrictions: No      Mobility  Bed Mobility Overal bed mobility: Needs Assistance Bed Mobility: Supine to Sit;Sit to Supine     Supine to sit: Max assist Sit to supine: Max assist   General bed mobility comments: max A for management of LE in and out of bed and for trunk from bed surface to sitting  Transfers                 General transfer comment: did not attempt due to decreased LE strength                          Balance Overall balance assessment: Needs assistance Sitting-balance support: Bilateral upper extremity supported;Feet supported Sitting balance-Leahy Scale: Poor Sitting balance - Comments: initially requires minA able to progress to bilateral UE support on bed surface and feet on floor to maintain balance for max of  20 sec Postural control: Posterior lean                                   Pertinent Vitals/Pain Pain Assessment: No/denies pain    Home Living Family/patient expects to be discharged to:: Private residence Living Arrangements: Children Available Help at Discharge: Family;Available 24 hours/day Type of Home: House Home Access: Stairs to enter Entrance Stairs-Rails: None Entrance Stairs-Number of Steps: 4 Home Layout: One level Home Equipment: None      Prior Function Level of Independence: Independent         Comments: reports ambulation without assist, able to perform ADLs, son supervises showers to insure safety        Extremity/Trunk Assessment   Upper Extremity Assessment Upper Extremity Assessment: Overall WFL for tasks assessed    Lower Extremity Assessment Lower Extremity Assessment: RLE deficits/detail;LLE deficits/detail RLE Deficits / Details: AAROM limited in hip and knee flexion, decreased dorsiflexion, strength grossly assessed at 2+/5 RLE Sensation: decreased light touch RLE Coordination: decreased fine motor LLE Deficits / Details: AAROM WFL, strength grossly assessed at 3+/5       Communication   Communication: No difficulties  Cognition Arousal/Alertness: Awake/alert Behavior During Therapy: WFL for tasks assessed/performed Overall Cognitive Status: History of cognitive impairments - at baseline  General Comments: decreased orientation to place and time, slowed recall, able to follow commands consistently with increased time      General Comments General comments (skin integrity, edema, etc.): LE edema, VSS, Son present throughout session and provided additional information on home living and independence        Assessment/Plan    PT Assessment Patient needs continued PT services  PT Problem List Decreased strength;Decreased range of motion;Decreased activity tolerance;Decreased  balance;Decreased mobility;Decreased coordination;Decreased safety awareness;Decreased cognition       PT Treatment Interventions DME instruction;Gait training;Functional mobility training;Therapeutic activities;Therapeutic exercise;Balance training;Cognitive remediation;Patient/family education    PT Goals (Current goals can be found in the Care Plan section)  Acute Rehab PT Goals Patient Stated Goal: go home PT Goal Formulation: With patient/family Time For Goal Achievement: 12/25/17 Potential to Achieve Goals: Fair    Frequency Min 2X/week    AM-PAC PT "6 Clicks" Daily Activity  Outcome Measure Difficulty turning over in bed (including adjusting bedclothes, sheets and blankets)?: Unable Difficulty moving from lying on back to sitting on the side of the bed? : Unable Difficulty sitting down on and standing up from a chair with arms (e.g., wheelchair, bedside commode, etc,.)?: Unable Help needed moving to and from a bed to chair (including a wheelchair)?: Total Help needed walking in hospital room?: Total Help needed climbing 3-5 steps with a railing? : Total 6 Click Score: 6    End of Session   Activity Tolerance: Patient limited by fatigue Patient left: in bed;with call bell/phone within reach;with bed alarm set;with family/visitor present;with SCD's reapplied Nurse Communication: Mobility status PT Visit Diagnosis: Unsteadiness on feet (R26.81);Other abnormalities of gait and mobility (R26.89);Muscle weakness (generalized) (M62.81)    Time: [id number] PT Time Calculation (min) (ACUTE ONLY): 26 min   Charges:   PT Evaluation $PT Eval Moderate Complexity: 1 Mod PT Treatments $Therapeutic Activity: 8-22 mins        Erianna Jolly B. Beverely RisenVan Fleet PT, DPT Acute Rehabilitation Services Pager [phone] Office [phone]) [phone]   Elon Alaslizabeth B Van Fleet 12/11/2017, 5:22 PM

## 2017-12-11 NOTE — Progress Notes (Signed)
PROGRESS NOTE                                                                                                                                                                                                             Patient Demographics:    Tanner Gilbert, is a 67 y.o. male, DOB - [birth year], ZOX:[id number]  Admit date - 12/08/2017   Admitting Physician Eduard Clos, MD  Outpatient Primary MD for the patient is Copland, Gwenlyn Found, MD  LOS - 3   Chief Complaint  Patient presents with  . Shortness of Breath       Brief Narrative    67 y.o. male with history of atrial fibrillation, chronic systolic heart failure last EF measured in 2017 was 40 to 45%, hypertension was brought to the ER because of shortness of breath, initial suspicion was for acute on chronic systolic CH, PE ruled out with VQ scan, as well he was noted to have acute on chronic renal failure, hypotensive, with projectile vomiting, initially on heparin GTT for anticoagulation, stopped with negative VQ scan, and hematoma in the psoas muscle, CT abdomen pelvis with no acute findings, creatinine improving with IV fluids.   Subjective:    Tanner Gilbert today had multiple bowel movements yesterday, no further nausea or vomiting, denies any chest pain or shortness of breath   Assessment  & Plan :    Principal Problem:   Acute respiratory failure with hypoxia (HCC) Active Problems:   HTN (hypertension) with goal to be determined   Acute on chronic systolic heart failure (HCC)   Persistent atrial fibrillation (HCC)   ARF (acute renal failure) (HCC)   Nausea and vomiting   Hypotension   SIRS (systemic inflammatory response syndrome) (HCC)   SIRS -Patient presents with tachycardia, hypotension, leukocytosis, as well having elevated procalcitonin, -Infectious etiology could be found so far on CT abdomen pelvis, chest x-ray with no pneumonia, negative urine analysis, but she  does have elevated procalcitonin, follow on blood cultures, can continue empirically with IV Zosyn. -Calcitonin trending down which is reassuring, so far his septic work-up is negative, I will DC IV Zosyn in the next 24 hours if work-up remains negative. -Tapering down stress dose hydrocortisone -Monitor critical care consult greatly appreciated -Continue with IV fluids  Acute renal failure on CKD -Most likely in the setting  of ATN, from volume depletion/hypotension, with Lasix use at home, improvement despite aggressive fluid resuscitation, no evidence of urinary retention or hydronephrosis on imaging, Foley catheter inserted,  -Function improving today, continue with IV fluids -Follow on total CK  Psoas hematoma -Hemoglobin overall stable, dropped slightly secondary to fluid resuscitation, hold anti-coagulation for now  Acute respiratory failure with hypoxia  -Thought to be secondary to acute on chronic systolic CHF, EF 40 to 45% in 4132, initially requiring BiPAP, significantly improved with IV diuresis . -Currently on 2 L nasal cannula with no evidence of volume overload after diuresis. -VQ scan negative for PE . -Continue to hold BiDil, Coreg and Lasix in the setting of hypotension  Acute on chronic systolic CHF -Felt to be volume overloaded on admission, was on IV diuresis, last echo 2017, echo is pending, appears to be dry currently, following repeat echo  History of atrial fibrillation  -Titrate elevated on admission, improved with Cardizem bolus, currently hypotensive, will hold Cardizem and Coreg as well . -He is on Xarelto at home, on hold given worsening renal function, heparin drip, currently on hold, especially in the setting of psoas hematoma.  Elevated LFTs -He has benign abdominal exam, some nonspecific finding on right upper quadrant ultrasound, gallbladder unremarkable on ultrasound, trending down, continue to monitor.  Nausea and vomiting -Patient with multiple  projectile vomiting yesterday, x-ray initially suspicious for gastric outlet obstruction, but none could be found on CT abdomen pelvis, having good bowel movements, NGT was accidentally dislodged today, but it was clamped over the last 5 hours with no nausea or vomiting, will start on clear liquid diet and monitor closely  Anemia -No evidence of overt GI bleed, globin drop most likely delusional, but I will obtain anemia panel   Code Status : Full  Family Communication  : Son at bedside  Disposition Plan  : remains in stepdown  Consults  :  none  Procedures  : none  DVT Prophylaxis  :  SCD  Lab Results  Component Value Date   PLT 187 12/11/2017    Antibiotics  :    Anti-infectives (From admission, onward)   Start     Dose/Rate Route Frequency Ordered Stop   12/09/17 1800  piperacillin-tazobactam (ZOSYN) IVPB 3.375 g  Status:  Discontinued     3.375 g 12.5 mL/hr over 240 Minutes Intravenous Every 8 hours 12/09/17 1648 12/09/17 1736   12/09/17 1737  piperacillin-tazobactam (ZOSYN) IVPB 2.25 g     2.25 g 100 mL/hr over 30 Minutes Intravenous Every 8 hours 12/09/17 1736          Objective:   Vitals:   12/10/17 2300 12/11/17 0212 12/11/17 0400 12/11/17 0857  BP: 125/85  122/86 127/87  Pulse: (!) 112     Resp: 20   16  Temp: (!) 97.3 F (36.3 C)  98 F (36.7 C) 98 F (36.7 C)  TempSrc: Oral  Oral Axillary  SpO2: 99%     Weight:  94.3 kg    Height:        Wt Readings from Last 3 Encounters:  12/11/17 94.3 kg  10/10/15 134.1 kg  10/08/15 135.4 kg     Intake/Output Summary (Last 24 hours) at 12/11/2017 1506 Last data filed at 12/11/2017 0300 Gross per 24 hour  Intake 2853.06 ml  Output 1150 ml  Net 1703.06 ml     Physical Exam  Awake Alert, Oriented X 3, No new F.N deficits, Normal affect Symmetrical Chest wall movement, Good air  movement bilaterally, CTAB RRR,No Gallops,Rubs or new Murmurs, No Parasternal Heave +ve B.Sounds, Abd Soft, No tenderness, No  rebound - guarding or rigidity. No Cyanosis, Clubbing or edema, No new Rash or bruise       Data Review:    CBC Recent Labs  Lab 12/08/17 1913 12/09/17 0607 12/09/17 1952 12/10/17 0426 12/11/17 0235  WBC 14.7* 9.8 10.3 10.9* 7.4  HGB 12.4* 10.2* 10.1* 10.1* 8.7*  HCT 37.3* 30.4* 30.1* 29.6* 26.0*  PLT 368 249 212 219 187  MCV 94.2 91.6 92.9 91.1 93.2  MCH 31.3 30.7 31.2 31.1 31.2  MCHC 33.2 33.6 33.6 34.1 33.5  RDW 15.2 14.9 15.0 15.0 15.2    Chemistries  Recent Labs  Lab 12/08/17 1913 12/08/17 2257 12/09/17 0607 12/10/17 0426 12/11/17 0235  NA 135  --  136 137 141  K 3.7  --  4.3 3.4* 2.9*  CL 102  --  102 103 111  CO2 10*  --  15* 18* 16*  GLUCOSE 121*  --  135* 105* 117*  BUN 35*  --  43* 58* 55*  CREATININE 3.44*  --  3.98* 4.53* 3.54*  CALCIUM 8.9  --  8.3* 8.1* 7.6*  MG  --  1.7  --   --   --   AST  --   --  111* 80* 61*  ALT  --   --  37 35 33  ALKPHOS  --   --  67 55 57  BILITOT  --   --  4.9* 3.6* 3.6*   ------------------------------------------------------------------------------------------------------------------ No results for input(s): CHOL, HDL, LDLCALC, TRIG, CHOLHDL, LDLDIRECT in the last 72 hours.  Lab Results  Component Value Date   HGBA1C 5.9 10/08/2015   ------------------------------------------------------------------------------------------------------------------ Recent Labs    12/08/17 2257  TSH 3.400   ------------------------------------------------------------------------------------------------------------------ No results for input(s): VITAMINB12, FOLATE, FERRITIN, TIBC, IRON, RETICCTPCT in the last 72 hours.  Coagulation profile No results for input(s): INR, PROTIME in the last 168 hours.  Recent Labs    12/08/17 1938  DDIMER 1.39*    Cardiac Enzymes Recent Labs  Lab 12/08/17 2257 12/09/17 0607 12/09/17 1435  TROPONINI 0.05* 0.05* 0.04*    ------------------------------------------------------------------------------------------------------------------    Component Value Date/Time   BNP 976.0 (H) 12/08/2017 1913   BNP 336.8 (H) 08/21/2014 1624    Inpatient Medications  Scheduled Meds: . heparin injection (subcutaneous)  5,000 Units Subcutaneous Q8H  . hydrocortisone sod succinate (SOLU-CORTEF) inj  50 mg Intravenous Q8H   Continuous Infusions: . piperacillin-tazobactam (ZOSYN)  IV 2.25 g (12/11/17 0802)  .  sodium bicarbonate (isotonic) infusion in sterile water 125 mL/hr at 12/11/17 1350   PRN Meds:.acetaminophen **OR** acetaminophen, ondansetron **OR** ondansetron (ZOFRAN) IV  Micro Results Recent Results (from the past 240 hour(s))  MRSA PCR Screening     Status: None   Collection Time: 12/08/17 10:44 PM  Result Value Ref Range Status   MRSA by PCR NEGATIVE NEGATIVE Final    Comment:        The GeneXpert MRSA Assay (FDA approved for NASAL specimens only), is one component of a comprehensive MRSA colonization surveillance program. It is not intended to diagnose MRSA infection nor to guide or monitor treatment for MRSA infections. Performed at [facility], [address] [place], [place] [address]   Culture, blood (Routine X 2) w Reflex to ID Panel     Status: None (Preliminary result)   Collection Time: 12/10/17  7:30 AM  Result Value Ref Range Status  Specimen Description BLOOD RIGHT ANTECUBITAL  Final   Special Requests   Final    BOTTLES DRAWN AEROBIC AND ANAEROBIC Blood Culture adequate volume   Culture   Final    NO GROWTH 1 DAY Performed at [facility], [address], [place], [place] [address]    Report Status PENDING  Incomplete  Culture, blood (Routine X 2) w Reflex to ID Panel     Status: None (Preliminary result)   Collection Time: 12/10/17  7:45 AM  Result Value Ref Range Status   Specimen Description BLOOD RIGHT FOREARM  Final   Special Requests   Final    BOTTLES  DRAWN AEROBIC AND ANAEROBIC Blood Culture results may not be optimal due to an excessive volume of blood received in culture bottles   Culture   Final    NO GROWTH 1 DAY Performed at [facility], [address], [place], [place] [address]    Report Status PENDING  Incomplete    Radiology Reports Ct Abdomen Pelvis Wo Contrast  Result Date: 12/09/2017 CLINICAL DATA:  Nausea, projectile vomiting, gastric outlet obstruction question by radiographs, excessive diarrhea, hypertension, CHF, atrial fibrillation EXAM: CT ABDOMEN AND PELVIS WITHOUT CONTRAST TECHNIQUE: Multidetector CT imaging of the abdomen and pelvis was performed following the standard protocol without IV contrast. Sagittal and coronal MPR images reconstructed from axial data set. GI contrast administered. Scattered respiratory motion artifacts are present. COMPARISON:  None FINDINGS: Lower chest: Subsegmental atelectasis RIGHT lower lobe. LEFT lung base clear. Hepatobiliary: Marked fatty infiltration of liver. Gallbladder unremarkable. Pancreas: Normal appearance Spleen: Normal appearance Adrenals/Urinary Tract: Adrenal glands, kidneys, ureters, and bladder grossly normal appearance within the limitations imposed by motion artifacts Stomach/Bowel: Stool in rectum. Large and small bowel loops unremarkable. Nasogastric tube in stomach. Contrast and air within stomach without definite evidence of gastric mass or outlet obstruction. Vascular/Lymphatic: Atherosclerotic calcifications aorta and iliac arteries as well as coronary arteries, including LEFT main coronary artery. Aorta normal caliber. No adenopathy. Reproductive: Unremarkable Other: Umbilical hernia containing fat. No definite free air or free fluid. Musculoskeletal: Diffuse osseous demineralization. No acute bony findings. Multilevel degenerative disc disease changes lower lumbar spine. Asymmetric enlargement and increased attenuation of the LEFT psoas muscle versus RIGHT compatible  with intramuscular hematoma. IMPRESSION: Intramuscular hematoma of the LEFT psoas muscle which is asymmetrically enlarged and hyperdense versus RIGHT. Fatty infiltration of liver. No definite evidence of gastric outlet obstruction or gastric mass. Umbilical hernia containing fat. Scattered atherosclerotic calcifications of aorta, iliac arteries and coronary arteries including LEFT main coronary artery. Electronically Signed   By: Ulyses Southward M.D.   On: 12/09/2017 23:34   Nm Pulmonary Perf And Vent  Result Date: 12/09/2017 CLINICAL DATA:  Shortness of breath EXAM: NUCLEAR MEDICINE VENTILATION - PERFUSION LUNG SCAN VIEWS: Anterior, posterior, LPO, RPO, LAO, RAO-ventilation and perfusion RADIOPHARMACEUTICALS:  31.2 mCi of Tc-83m DTPA aerosol inhalation and 4.2 mCi Tc44m-MAA IV COMPARISON:  Chest radiograph December 08, 2017 FINDINGS: Ventilation: Radiotracer uptake on the ventilation study is homogeneous and symmetric bilaterally. No ventilation defects are evident. There is elevation of the right hemidiaphragm, noted on chest radiograph. Perfusion: Radiotracer uptake is homogeneous and symmetric bilaterally. No perfusion defects are evident. There is elevation of the right hemidiaphragm. IMPRESSION: No appreciable ventilation or perfusion defect. This study constitutes a very low probability of pulmonary embolus. There is elevation of the right hemidiaphragm. Electronically Signed   By: Bretta Bang III M.D.   On: 12/09/2017 10:05   Dg Chest  Portable 1 View  Result Date: 12/08/2017 CLINICAL DATA:  Short of breath and weakness since 630 today EXAM: PORTABLE CHEST 1 VIEW COMPARISON:  09/08/2015 FINDINGS: Mild cardiomegaly. Normal vascularity. Right hemidiaphragm remains elevated. Bibasilar atelectasis. IMPRESSION: Cardiomegaly without decompensation. Bibasilar atelectasis. Electronically Signed   By: Jolaine Click M.D.   On: 12/08/2017 19:43   Dg Abd Portable 1v  Result Date: 12/09/2017 CLINICAL DATA:   Nausea and vomiting EXAM: PORTABLE ABDOMEN - 1 VIEW COMPARISON:  Abdominal ultrasound of December 09, 2017 FINDINGS: The stomach is moderately distended with air. No small or large bowel obstruction is observed. The stool burden does not appear excessive. There are no abnormal soft tissue calcifications. There are degenerative changes at the L3-4 and L4-5 disc levels. IMPRESSION: Moderate gaseous distention of the stomach may reflect gastric outlet obstruction. No evidence of a small or large bowel obstructive process. Electronically Signed   By: David  [place] M.D.   On: 12/09/2017 15:17   US Abdomen Limited Ruq  Result Date: 12/09/2017 CLINICAL DATA:  Hyperbilirubinemia EXAM: ULTRASOUND ABDOMEN LIMITED RIGHT UPPER QUADRANT COMPARISON:  None. FINDINGS: Gallbladder: Poor visualization of the gallbladder which appears to be contracted and mildly thick-walled at 3 mm. Orientation of the gallbladder and patient body habitus made assessment problematic. Small nonshadowing filling defects in the gallbladder for example on image 5 potentially from tumefactive sludge. This was not interrogated by Doppler to assess for solid mass. Strictly speaking a gallbladder polyp could have a similar appearance for example on image 8. Sonographic Eulah Pont sign was absent.  No definite shadowing calculi. Common bile duct: Diameter: 4 mm Liver: Coarse echogenic liver with poor sonic penetration compatible with diffuse hepatic steatosis. Portal vein is patent on color Doppler imaging with normal direction of blood flow towards the liver. IMPRESSION: 1. Technically difficult exam due to orientation of the gallbladder and the patient's body habitus. Also the gallbladder appears somewhat contracted and mildly thick-walled, although sonographic Murphy sign was absent. There is some complex rounded filling defect in the gallbladder which is probably tumefactive sludge, along with several small nonshadowing intraluminal hyperechogenicies. The  possibility of a polyp or small gallbladder mass is not excluded on today's exam. Given the limited liver enzymes and constellation of findings, consider MRI/MRCP with and without contrast for further characterization. 2. Coarse echogenic liver with poor sonic penetration compatible with diffuse hepatic steatosis. Electronically Signed   By: Gaylyn Rong M.D.   On: 12/09/2017 12:50     Huey Bienenstock M.D on 12/11/2017 at 3:06 PM  Between 7am to 7pm - Pager - [phone]  After 7pm go to [email or web address] - password [facility]  [facility] -  Office  [phone]

## 2017-12-11 NOTE — Progress Notes (Signed)
Pt pulled his NG tube out. RN reported finding to MD. Pt tolerated having NG tube clamped for 4 hours today. MD verbalized for RN to not replace NG tube at this time, but to call back if pt begins to vomit and/or increasingly worse nausea.

## 2017-12-11 NOTE — Plan of Care (Signed)
Patient is often confused.  His understanding of the reason for his hospitalization is very limited.  Patient displayed no teach back.

## 2017-12-11 NOTE — Progress Notes (Addendum)
Subjective: Interval History: Patient feels improved today. He has no N/V, abdominal pain, SOB, chest pain. He is wondering when he can eat and when he will be discharged. We discussed that from a kidney standpoint his creatinine has improved. We are continuing IV hydration and monitoring his renal function.   Objective: Vital signs in last 24 hours: Temp:  [97.3 F (36.3 C)-98 F (36.7 C)] 98 F (36.7 C) (09/13 0400) Pulse Rate:  [91-112] 112 (09/12 2300) Resp:  [17-20] 20 (09/12 2300) BP: (96-132)/(72-97) 122/86 (09/13 0400) SpO2:  [96 %-99 %] 99 % (09/12 2300) Weight:  [94.3 kg] 94.3 kg (09/13 0212) Weight change:   Intake/Output from previous day: 09/12 0701 - 09/13 0700 In: 2853.1 [I.V.:1203.1; IV Piggyback:1650] Out: 1150 [Urine:150; Emesis/NG output:1000]   Intake/Output this shift: No intake/output data recorded.  General: Well nourished male in no acute distress HENT: NG tube in place Pulm: Good air movement with no wheezing or crackles  CV: RRR, no murmurs, no rubs  Abdomen: Active bowel sounds, soft, non-distended, no tenderness to palpation  Extremities: No LE edema   Lab Results: Recent Labs    12/10/17 0426 12/11/17 0235  WBC 10.9* 7.4  HGB 10.1* 8.7*  HCT 29.6* 26.0*  PLT 219 187   BMET:  Recent Labs    12/10/17 0426 12/11/17 0235  NA 137 141  K 3.4* 2.9*  CL 103 111  CO2 18* 16*  GLUCOSE 105* 117*  BUN 58* 55*  CREATININE 4.53* 3.54*  CALCIUM 8.1* 7.6*   No results for input(s): PTH in the last 72 hours.   Iron Studies: No results for input(s): IRON, TIBC, TRANSFERRIN, FERRITIN in the last 72 hours.   Studies/Results: Ct Abdomen Pelvis Wo Contrast  Result Date: 12/09/2017 CLINICAL DATA:  Nausea, projectile vomiting, gastric outlet obstruction question by radiographs, excessive diarrhea, hypertension, CHF, atrial fibrillation EXAM: CT ABDOMEN AND PELVIS WITHOUT CONTRAST TECHNIQUE: Multidetector CT imaging of the abdomen and pelvis was  performed following the standard protocol without IV contrast. Sagittal and coronal MPR images reconstructed from axial data set. GI contrast administered. Scattered respiratory motion artifacts are present. COMPARISON:  None FINDINGS: Lower chest: Subsegmental atelectasis RIGHT lower lobe. LEFT lung base clear. Hepatobiliary: Marked fatty infiltration of liver. Gallbladder unremarkable. Pancreas: Normal appearance Spleen: Normal appearance Adrenals/Urinary Tract: Adrenal glands, kidneys, ureters, and bladder grossly normal appearance within the limitations imposed by motion artifacts Stomach/Bowel: Stool in rectum. Large and small bowel loops unremarkable. Nasogastric tube in stomach. Contrast and air within stomach without definite evidence of gastric mass or outlet obstruction. Vascular/Lymphatic: Atherosclerotic calcifications aorta and iliac arteries as well as coronary arteries, including LEFT main coronary artery. Aorta normal caliber. No adenopathy. Reproductive: Unremarkable Other: Umbilical hernia containing fat. No definite free air or free fluid. Musculoskeletal: Diffuse osseous demineralization. No acute bony findings. Multilevel degenerative disc disease changes lower lumbar spine. Asymmetric enlargement and increased attenuation of the LEFT psoas muscle versus RIGHT compatible with intramuscular hematoma. IMPRESSION: Intramuscular hematoma of the LEFT psoas muscle which is asymmetrically enlarged and hyperdense versus RIGHT. Fatty infiltration of liver. No definite evidence of gastric outlet obstruction or gastric mass. Umbilical hernia containing fat. Scattered atherosclerotic calcifications of aorta, iliac arteries and coronary arteries including LEFT main coronary artery. Electronically Signed   By: Ulyses SouthwardMark  Boles M.D.   On: 12/09/2017 23:34   Nm Pulmonary Perf And Vent  Result Date: 12/09/2017 CLINICAL DATA:  Shortness of breath EXAM: NUCLEAR MEDICINE VENTILATION - PERFUSION LUNG SCAN VIEWS:  Anterior, posterior,  LPO, RPO, LAO, RAO-ventilation and perfusion RADIOPHARMACEUTICALS:  31.2 mCi of Tc-48m DTPA aerosol inhalation and 4.2 mCi Tc12m-MAA IV COMPARISON:  Chest radiograph December 08, 2017 FINDINGS: Ventilation: Radiotracer uptake on the ventilation study is homogeneous and symmetric bilaterally. No ventilation defects are evident. There is elevation of the right hemidiaphragm, noted on chest radiograph. Perfusion: Radiotracer uptake is homogeneous and symmetric bilaterally. No perfusion defects are evident. There is elevation of the right hemidiaphragm. IMPRESSION: No appreciable ventilation or perfusion defect. This study constitutes a very low probability of pulmonary embolus. There is elevation of the right hemidiaphragm. Electronically Signed   By: Bretta Bang III M.D.   On: 12/09/2017 10:05   Dg Abd Portable 1v  Result Date: 12/09/2017 CLINICAL DATA:  Nausea and vomiting EXAM: PORTABLE ABDOMEN - 1 VIEW COMPARISON:  Abdominal ultrasound of December 09, 2017 FINDINGS: The stomach is moderately distended with air. No small or large bowel obstruction is observed. The stool burden does not appear excessive. There are no abnormal soft tissue calcifications. There are degenerative changes at the L3-4 and L4-5 disc levels. IMPRESSION: Moderate gaseous distention of the stomach may reflect gastric outlet obstruction. No evidence of a small or large bowel obstructive process. Electronically Signed   By: David  [place] M.D.   On: 12/09/2017 15:17   US Abdomen Limited Ruq  Result Date: 12/09/2017 CLINICAL DATA:  Hyperbilirubinemia EXAM: ULTRASOUND ABDOMEN LIMITED RIGHT UPPER QUADRANT COMPARISON:  None. FINDINGS: Gallbladder: Poor visualization of the gallbladder which appears to be contracted and mildly thick-walled at 3 mm. Orientation of the gallbladder and patient body habitus made assessment problematic. Small nonshadowing filling defects in the gallbladder for example on image 5  potentially from tumefactive sludge. This was not interrogated by Doppler to assess for solid mass. Strictly speaking a gallbladder polyp could have a similar appearance for example on image 8. Sonographic Eulah Pont sign was absent.  No definite shadowing calculi. Common bile duct: Diameter: 4 mm Liver: Coarse echogenic liver with poor sonic penetration compatible with diffuse hepatic steatosis. Portal vein is patent on color Doppler imaging with normal direction of blood flow towards the liver. IMPRESSION: 1. Technically difficult exam due to orientation of the gallbladder and the patient's body habitus. Also the gallbladder appears somewhat contracted and mildly thick-walled, although sonographic Murphy sign was absent. There is some complex rounded filling defect in the gallbladder which is probably tumefactive sludge, along with several small nonshadowing intraluminal hyperechogenicies. The possibility of a polyp or small gallbladder mass is not excluded on today's exam. Given the limited liver enzymes and constellation of findings, consider MRI/MRCP with and without contrast for further characterization. 2. Coarse echogenic liver with poor sonic penetration compatible with diffuse hepatic steatosis. Electronically Signed   By: Gaylyn Rong M.D.   On: 12/09/2017 12:50   Scheduled: . hydrocortisone sod succinate (SOLU-CORTEF) inj  50 mg Intravenous Q8H   Assessment/Plan:  AKI on CKD, oliguric  - Patient presented with hypovolemia and hypotension leading to pre-renal renal insult.  - Creatinine improved to 3.54 today (baseline 1.3-1.4) - Urine output only recorded as 150 mL but ~400 mL of amber colored urine in foley bag  - Repeat FeNa still <1%, consistent with pre-renal etiology  - Labs reviewed. K 2.9, NAGMA, BUN 55, Ca 9.2, Hgb 8.7  - No indication for HD at this point, would continue aggressive IV hydration especially with >1L removed from NG tube over past 24 hours.   NAGMA - HCO2 16  -  Start sodium bicarb  Hypotension  - Improved, range 96/72-132/97  Normocytic Anemia  - Hgb 8.7 from baseline of ~12  - RDW normal  - Continue to monitor   Hypokalemia  - Replete as needed   Will discuss case further with Dr. Ronalee Belts.    LOS: 3 days   [facility] 12/11/2017,8:10 AM

## 2017-12-11 NOTE — Care Management Important Message (Signed)
Important Message  Patient Details  Name: Tanner Gilbert MRN: [id number] Date of Birth: 03/23/1951   Medicare Important Message Given:  Yes    Abagale Boulos Stefan ChurchBratton 12/11/2017, 3:29 PM

## 2017-12-12 ENCOUNTER — Inpatient Hospital Stay (HOSPITAL_COMMUNITY): Payer: Medicare Other

## 2017-12-12 LAB — CBC
HEMATOCRIT: 27.1 % — AB (ref 39.0–52.0)
HEMOGLOBIN: 9 g/dL — AB (ref 13.0–17.0)
MCH: 30.9 pg (ref 26.0–34.0)
MCHC: 33.2 g/dL (ref 30.0–36.0)
MCV: 93.1 fL (ref 78.0–100.0)
Platelets: 179 10*3/uL (ref 150–400)
RBC: 2.91 MIL/uL — AB (ref 4.22–5.81)
RDW: 15.5 % (ref 11.5–15.5)
WBC: 7.4 10*3/uL (ref 4.0–10.5)

## 2017-12-12 LAB — RENAL FUNCTION PANEL
Albumin: 2.2 g/dL — ABNORMAL LOW (ref 3.5–5.0)
Anion gap: 13 (ref 5–15)
BUN: 40 mg/dL — ABNORMAL HIGH (ref 8–23)
CHLORIDE: 110 mmol/L (ref 98–111)
CO2: 22 mmol/L (ref 22–32)
Calcium: 8 mg/dL — ABNORMAL LOW (ref 8.9–10.3)
Creatinine, Ser: 2.31 mg/dL — ABNORMAL HIGH (ref 0.61–1.24)
GFR, EST AFRICAN AMERICAN: 32 mL/min — AB (ref >60)
GFR, EST NON AFRICAN AMERICAN: 27 mL/min — AB (ref >60)
Glucose, Bld: 132 mg/dL — ABNORMAL HIGH (ref 70–99)
POTASSIUM: 3.1 mmol/L — AB (ref 3.5–5.1)
Phosphorus: 3.2 mg/dL (ref 2.5–4.6)
Sodium: 145 mmol/L (ref 135–145)

## 2017-12-12 LAB — HEPATIC FUNCTION PANEL
ALK PHOS: 58 U/L (ref 38–126)
ALT: 45 U/L — AB (ref 0–44)
AST: 99 U/L — ABNORMAL HIGH (ref 15–41)
Albumin: 2.2 g/dL — ABNORMAL LOW (ref 3.5–5.0)
Bilirubin, Direct: 1.5 mg/dL — ABNORMAL HIGH (ref 0.0–0.2)
Indirect Bilirubin: 2.1 mg/dL — ABNORMAL HIGH (ref 0.3–0.9)
TOTAL PROTEIN: 5.9 g/dL — AB (ref 6.5–8.1)
Total Bilirubin: 3.6 mg/dL — ABNORMAL HIGH (ref 0.3–1.2)

## 2017-12-12 LAB — MAGNESIUM: MAGNESIUM: 1.6 mg/dL — AB (ref 1.7–2.4)

## 2017-12-12 MED ORDER — POTASSIUM CHLORIDE 10 MEQ/100ML IV SOLN
10.0000 meq | INTRAVENOUS | Status: AC
  Administered 2017-12-12 (×2): 10 meq via INTRAVENOUS
  Filled 2017-12-12 (×2): qty 100

## 2017-12-12 MED ORDER — MAGNESIUM SULFATE 2 GM/50ML IV SOLN
2.0000 g | Freq: Once | INTRAVENOUS | Status: AC
  Administered 2017-12-12: 2 g via INTRAVENOUS
  Filled 2017-12-12: qty 50

## 2017-12-12 MED ORDER — POTASSIUM CHLORIDE IN NACL 20-0.9 MEQ/L-% IV SOLN
INTRAVENOUS | Status: DC
  Administered 2017-12-12 – 2017-12-13 (×2): via INTRAVENOUS
  Filled 2017-12-12 (×3): qty 1000

## 2017-12-12 NOTE — Consult Note (Signed)
[facility] Consultation Note  Referring Provider:  [facility] Primary Care Physician:  Patsy Lageropland, Gwenlyn FoundJessica C, MD  Reason for Consultation:  Nausea and vomiting  HPI: Tanner Gilbert is a 67 y.o. male admitted for acute on chronic renal failure and hypoxic respiratory failure.  Asked to see for nausea and vomiting.  Patient is confused and can provide minimal history.  His son is at bedside.  Patient apparently with few days of nausea and vomiting; unclear when started or whether patient has had previously.  No reported diarrhea, blood in stool.  Is still having bowel movements.  Son thinks dad has lost some weight.  Unclear if prior endoscopy or work-up.  Xray studies with diffuse air in stomach, small bowel, and colon; CT scan without obvious gastric outlet obstruction.   Past Medical History:  Diagnosis Date  . Atrial fibrillation (HCC)   . CHF (congestive heart failure) (HCC)   . Hypertension   . Obesity     Past Surgical History:  Procedure Laterality Date  . CARDIOVERSION N/A 10/15/2015   Procedure: CARDIOVERSION;  Surgeon: Quintella Reichertraci R Turner, MD;  Location: [facility] ENDOSCOPY;  Service: Cardiovascular;  Laterality: N/A;    Prior to Admission medications   Medication Sig Start Date End Date Taking? Authorizing Provider  carvedilol (COREG) 25 MG tablet Take 1 tablet (25 mg total) by mouth 2 (two) times daily with a meal. 07/14/17  Yes Copland, Gwenlyn FoundJessica C, MD  furosemide (LASIX) 40 MG tablet Take 1 tablet (40 mg total) by mouth daily. 07/14/17  Yes Copland, Gwenlyn FoundJessica C, MD  isosorbide-hydrALAZINE (BIDIL) 20-37.5 MG tablet Take 1 tablet by mouth 3 (three) times daily. 05/07/16  Yes Copland, Gwenlyn FoundJessica C, MD  Potassium Chloride ER 20 MEQ TBCR Take 20 mEq by mouth daily. 12/19/16  Yes Copland, Gwenlyn FoundJessica C, MD  rivaroxaban (XARELTO) 20 MG TABS tablet TAKE 1 TABLET DAILY WITH SUPPER 07/14/17  Yes Copland, Gwenlyn FoundJessica C, MD  BIDIL 20-37.5 MG tablet TAKE 1 TABLET THREE TIMES A DAY Patient not taking:  Reported on 12/08/2017 12/12/16   Copland, Gwenlyn FoundJessica C, MD    Current Facility-Administered Medications  Medication Dose Route Frequency Provider Last Rate Last Dose  . 0.9 % NaCl with KCl 20 mEq/ L  infusion   Intravenous Continuous Maxie BarbBhandari, Dron Prasad, MD 100 mL/hr at 12/12/17 1127    . acetaminophen (TYLENOL) tablet 650 mg  650 mg Oral Q6H PRN Eduard ClosKakrakandy, Arshad N, MD       Or  . acetaminophen (TYLENOL) suppository 650 mg  650 mg Rectal Q6H PRN Eduard ClosKakrakandy, Arshad N, MD      . heparin injection 5,000 Units  5,000 Units Subcutaneous Q8H Elgergawy, Leana Roeawood S, MD   5,000 Units at 12/12/17 1312  . ondansetron (ZOFRAN) tablet 4 mg  4 mg Oral Q6H PRN Eduard ClosKakrakandy, Arshad N, MD       Or  . ondansetron [facility](ZOFRAN) injection 4 mg  4 mg Intravenous Q6H PRN Eduard ClosKakrakandy, Arshad N, MD        Allergies as of 12/08/2017 - Review Complete 12/08/2017  Allergen Reaction Noted  . Apixaban Other (See Comments) 09/08/2015  . Lisinopril Swelling 09/09/2013    Family History  Family history unknown: Yes    Social History   Socioeconomic History  . Marital status: Single    Spouse name: Not on file  . Number of children: Not on file  . Years of education: Not on file  . Highest education level: Not on file  Occupational History  .  Not on file  Social Needs  . Financial resource strain: Not on file  . Food insecurity:    Worry: Not on file    Inability: Not on file  . Transportation needs:    Medical: Not on file    Non-medical: Not on file  Tobacco Use  . Smoking status: Never Smoker  . Smokeless tobacco: Never Used  Substance and Sexual Activity  . Alcohol use: No  . Drug use: No  . Sexual activity: Yes  Lifestyle  . Physical activity:    Days per week: Not on file    Minutes per session: Not on file  . Stress: Not on file  Relationships  . Social connections:    Talks on phone: Not on file    Gets together: Not on file    Attends religious service: Not on file    Active member of club  or organization: Not on file    Attends meetings of clubs or organizations: Not on file    Relationship status: Not on file  . Intimate partner violence:    Fear of current or ex partner: Not on file    Emotionally abused: Not on file    Physically abused: Not on file    Forced sexual activity: Not on file  Other Topics Concern  . Not on file  Social History Narrative  . Not on file    Review of Systems: Unable to obtain due to patient's confusion  Physical Exam: Vital signs in last 24 hours: Temp:  [97.8 F (36.6 C)-98.7 F (37.1 C)] 98.4 F (36.9 C) (09/14 0843) Pulse Rate:  [70-95] 95 (09/14 0843) Resp:  [18-24] 18 (09/14 0843) BP: (121-145)/(87-99) 145/98 (09/14 0843) SpO2:  [96 %-100 %] 96 % (09/14 0843) Weight:  [95.3 kg] 95.3 kg (09/14 0539) Last BM Date: 12/10/17 General:  Awake, confused, tangential non-linear speech, NAD Head:  Normocephalic and atraumatic. Eyes:  Sclera clear, no icterus.   Conjunctiva pink. Ears:  Normal auditory acuity. Nose:  No deformity, discharge,  or lesions. Mouth:  No deformity or lesions.  Dry mucous membranes; poor dentition. Neck:  Supple; no masses or thyromegaly. Lungs:  Clear throughout to auscultation.   No wheezes, crackles, or rhonchi. No acute distress. Heart:  Regular rate and rhythm; no murmurs, clicks, rubs,  or gallops. Abdomen:  Soft, nontender and nondistended. No tympany or succussion; No masses, hepatosplenomegaly or hernias noted. Normal bowel sounds, without guarding, and without rebound.     Msk:  Symmetrical without gross deformities. Normal posture. Pulses:  Normal pulses noted. Extremities:  Without clubbing or edema. Neurologic:  Alert and  oriented x4; diffusely weak, otherwise grossly normal neurologically. Skin:  Intact without significant lesions or rashes. Cervical Nodes:  No significant cervical adenopathy. Psych:  Awake, confused, trouble with focusing, trouble answering questions   Lab  Results: Recent Labs    12/10/17 0426 12/11/17 0235 12/12/17 0613  WBC 10.9* 7.4 7.4  HGB 10.1* 8.7* 9.0*  HCT 29.6* 26.0* 27.1*  PLT 219 187 179   BMET Recent Labs    12/11/17 0235 12/11/17 1517 12/12/17 0613  NA 141 144 145  K 2.9* 3.5 3.1*  CL 111 113* 110  CO2 16* 18* 22  GLUCOSE 117* 119* 132*  BUN 55* 50* 40*  CREATININE 3.54* 2.97* 2.31*  CALCIUM 7.6* 7.7* 8.0*   LFT Recent Labs    12/12/17 1318  PROT 5.9*  ALBUMIN 2.2*  AST 99*  ALT 45*  ALKPHOS 58  BILITOT 3.6*  BILIDIR 1.5*  IBILI 2.1*   PT/INR No results for input(s): LABPROT, INR in the last 72 hours.  Studies/Results: Dg Abd Portable 1v  Result Date: 12/12/2017 CLINICAL DATA:  Vomiting for 2 days. EXAM: PORTABLE ABDOMEN - 1 VIEW COMPARISON:  CT scan 12/09/2017 FINDINGS: Air throughout the small bowel and colon with mild distension suggesting a diffuse ileus or gastroenteritis. No findings to suggest obstruction or perforation. Colonic interposition is noted. The visualized left lung base is grossly clear. There is some residual contrast in the rectum. IMPRESSION: Large amount of air throughout the stomach, small bowel and colon suggesting an ileus or gastroenteritis. No findings for obstruction or free air. Electronically Signed   By: Rudie Meyer M.D.   On: 12/12/2017 09:49    Impression:  1.  Acute respiratory failure, hypoxic, improving. 2.  Chronic systolic heart failure. 3.  Acute on chronic renal failure. 4.  Nausea and vomiting.  Possible ileus/gastroenteritis.  Having bowel movements.  Tolerating clear liquids earlier.   5.  Confusion.  Plan:  1.  Clear liquid diet, advance as tolerated. 2.  Supportive management for renal failure and respiratory failure. 3.  Correct hypokalemia. 4.  If confusion persists, does patient need work-up? 5.  Antiemetics. 6.  See how patient does with diet advancement; if persists, then consider endoscopy some time next week; however, patient not inclined  right now to have any invasive testing. 7.  [facility] will follow.   LOS: 4 days   Jacqeline Broers M  12/12/2017, 4:30 PM  Cell [phone] If no answer or after 5 PM call [phone]

## 2017-12-12 NOTE — Clinical Social Work Note (Signed)
Clinical Social Work Assessment  Patient Details  Name: Tanner Gilbert MRN: [id number] Date of Birth: May 25, 1950  Date of referral:  12/12/17               Reason for consult:  Facility Placement, Discharge Planning                Permission sought to share information with:  Facility Medical sales representativeContact Representative, Family Supports Permission granted to share information::     Name::     Billey Commanuel Crapps Jr.  Agency::  SNF's  Relationship::  Son  Contact Information:  [phone]  Housing/Transportation Living arrangements for the past 2 months:  Single Family Home Source of Information:  Medical Team, Adult Children Patient Interpreter Needed:  None Criminal Activity/Legal Involvement Pertinent to Current Situation/Hospitalization:  No - Comment as needed Significant Relationships:  Adult Children Lives with:  Adult Children Do you feel safe going back to the place where you live?  Yes Need for family participation in patient care:  Yes (Comment)  Care giving concerns:  PT recommending SNF once medically stable for discharge.   Social Worker assessment / plan:  Patient asleep but per RN assessments, not fully oriented. Son at bedside. CSW introduced role and explained that PT recommendations would be discussed. Patient's son will consider SNF. He does not want him in a facility for longer than 1-2 weeks but is hoping he will progress enough in the hospital that he can return home. Patient's son has concerns that he will be mistreated while in a SNF. CSW encouraged him to tour facilities but he stated he does not want to leave the hospital with fear that patient will be "taken advantage of" while he is gone. No further concerns. CSW encouraged patient's son to contact CSW as needed. CSW will continue to follow patient and his son for support and facilitate discharge to SNF once medically stable.  Employment status:  Retired Health and safety inspectornsurance information:  Medicare PT Recommendations:  Skilled Nursing  Facility Information / Referral to community resources:  Skilled Nursing Facility  Patient/Family's Response to care:  Patient not fully oriented/asleep. Patient's son considering SNF placement. Patient's son supportive and involved in patient's care. Patient's son appreciated social work intervention.  Patient/Family's Understanding of and Emotional Response to Diagnosis, Current Treatment, and Prognosis:  Patient not fully oriented/asleep. Patient's son has a good understanding of the reason for admission and his need for further therapy after discharge. Patient's son appears pleased with hospital care but does not want to leave him alone for fear that he will be "taken advantage of."  Emotional Assessment Appearance:  Appears stated age Attitude/Demeanor/Rapport:  Unable to Assess Affect (typically observed):  Unable to Assess Orientation:  Oriented to Self Alcohol / Substance use:  Never Used Psych involvement (Current and /or in the community):  No (Comment)  Discharge Needs  Concerns to be addressed:  Care Coordination Readmission within the last 30 days:  No Current discharge risk:  Dependent with Mobility Barriers to Discharge:  Continued Medical Work up, Other(Diet)   Margarito LinerSarah C Conal Shetley, LCSW 12/12/2017, 10:51 AM

## 2017-12-12 NOTE — Progress Notes (Signed)
PROGRESS NOTE                                                                                                                                                                                                             Patient Demographics:    Tanner Gilbert, is a 67 y.o. male, DOB - 02/19/1951, ZOX:[id number]  Admit date - 12/08/2017   Admitting Physician Eduard Clos, MD  Outpatient Primary MD for the patient is Copland, Gwenlyn Found, MD  LOS - 4   Chief Complaint  Patient presents with  . Shortness of Breath       Brief Narrative    67 y.o. male with history of atrial fibrillation, chronic systolic heart failure last EF measured in 2017 was 40 to 45%, hypertension was brought to the ER because of shortness of breath, initial suspicion was for acute on chronic systolic CH, PE ruled out with VQ scan, as well he was noted to have acute on chronic renal failure, hypotensive, with projectile vomiting, initially on heparin GTT for anticoagulation, stopped with negative VQ scan, and hematoma in the psoas muscle, CT abdomen pelvis with no acute findings, creatinine improving with IV fluids.   Subjective:    Ezel Vallone today had one episode of large amount of vomiting this morning, son reports bowel movement times once yesterday.   Assessment  & Plan :    Principal Problem:   Acute respiratory failure with hypoxia (HCC) Active Problems:   HTN (hypertension) with goal to be determined   Acute on chronic systolic heart failure (HCC)   Persistent atrial fibrillation (HCC)   ARF (acute renal failure) (HCC)   Nausea and vomiting   Hypotension   SIRS (systemic inflammatory response syndrome) (HCC)   SIRS -Patient presents with tachycardia, hypotension, leukocytosis, as well having elevated procalcitonin, -Infectious etiology could be found so far on CT abdomen pelvis, chest x-ray with no pneumonia, negative urine analysis, but he did have  elevated procalcitonin, blood cultures remain negative, he was empirically on IV Zosyn, procalcitonin trending down, blood pressure has improved, new IV Zosyn today, and I will stop stress dose hydrocortisone today as well. -Continue with IV fluids  Acute renal failure on CKD -Volume depletion, hypotension, most likely ATN, with Lasix use at home, volume depleted , appropriately resuscitated with IV fluids, renal function improving, no  evidence of retention or hydronephrosis on imaging . -Continue with Foley catheter  -Renal input appreciated   Psoas hematoma -Hemoglobin overall stable, dropped slightly secondary to fluid resuscitation, hold anti-coagulation for now  Acute respiratory failure with hypoxia  -Thought to be secondary to acute on chronic systolic CHF, EF 40 to 45% in [id number], initially requiring BiPAP, significantly improved with IV diuresis active admission. -Currently on 2 L nasal cannula with no evidence of volume overload after diuresis. -VQ scan negative for PE . -Continue to hold BiDil, Coreg and Lasix in the setting of hypotension  Nausea and vomiting -Patient with multiple projectile vomiting on admission, x-ray initially suspicious for gastric outlet obstruction, but none could be found on CT abdomen pelvis, having good bowel movements, was kept on NGT suction, accidentally dislodged yesterday, but it was clamped 4 hours before that with no nausea or vomiting, so he has been started on clear liquid diet . -Patient again this morning with large amount of vomiting, KUB showing evidence of ileus, I will replete potassium and magnesium, , have consulted GI evaluation for possible gastric outlet obstruction .  Hypokalemia/hypomagnesemia  -Repleted .  Acute on chronic systolic CHF -Felt to be volume overloaded on admission, was on IV diuresis, last echo 2017, with EF 40 to 45%, repeat echo this admission showing improved EF at 60% . -Resume by the low blood pressure is  appropriate, not ACE or are appropriate in the setting of renal failure, resume Coreg when able to take oral .  History of atrial fibrillation  -Titrate elevated on admission, improved with Cardizem bolus, currently hypotensive, will hold Cardizem and Coreg as well . -He is on Xarelto at home, on hold given worsening renal function, heparin drip, currently on hold, especially in the setting of psoas hematoma.  Elevated LFTs -He has benign abdominal exam, some nonspecific finding on right upper quadrant ultrasound, gallbladder unremarkable on ultrasound, trending down, continue to monitor.  Anemia of chronic disease -No evidence of overt GI bleed, living drop most likely due to delutional effect, anemia panel significant for anemia of chronic disease   Code Status : Full  Family Communication  : Son at bedside  Disposition Plan  : remains in stepdown  Consults  :  Renal, cardiology,PCCM, GI  Procedures  : none  DVT Prophylaxis  :  SCD, subcu heparin  Lab Results  Component Value Date   PLT 179 12/12/2017    Antibiotics  :    Anti-infectives (From admission, onward)   Start     Dose/Rate Route Frequency Ordered Stop   12/09/17 1800  piperacillin-tazobactam (ZOSYN) IVPB 3.375 g  Status:  Discontinued     3.375 g 12.5 mL/hr over 240 Minutes Intravenous Every 8 hours 12/09/17 1648 12/09/17 1736   12/09/17 1737  piperacillin-tazobactam (ZOSYN) IVPB 2.25 g  Status:  Discontinued     2.25 g 100 mL/hr over 30 Minutes Intravenous Every 8 hours 12/09/17 1736 12/12/17 0719        Objective:   Vitals:   12/11/17 2300 12/12/17 0300 12/12/17 0539 12/12/17 0843  BP: (!) 134/93   (!) 145/98  Pulse: 90 70  95  Resp: (!) 24 (!) 24  18  Temp: 98.6 F (37 C) 98.7 F (37.1 C)  98.4 F (36.9 C)  TempSrc: Oral Oral  Oral  SpO2: 100%   96%  Weight:   95.3 kg   Height:        Wt Readings from Last 3 Encounters:  12/12/17  95.3 kg  10/10/15 134.1 kg  10/08/15 135.4 kg      Intake/Output Summary (Last 24 hours) at 12/12/2017 1242 Last data filed at 12/11/2017 1545 Gross per 24 hour  Intake 888.8 ml  Output -  Net 888.8 ml     Physical Exam  Awake Alert, less communicative today , appears uncomfortable symmetrical Chest wall movement, Good air movement bilaterally, CTAB RRR,No Gallops,Rubs or new Murmurs, No Parasternal Heave +ve B.Sounds, Abd Soft, No tenderness, No rebound - guarding or rigidity. No Cyanosis, Clubbing or edema, No new Rash or bruise       Data Review:    CBC Recent Labs  Lab 12/09/17 0607 12/09/17 1952 12/10/17 0426 12/11/17 0235 12/12/17 0613  WBC 9.8 10.3 10.9* 7.4 7.4  HGB 10.2* 10.1* 10.1* 8.7* 9.0*  HCT 30.4* 30.1* 29.6* 26.0* 27.1*  PLT 249 212 219 187 179  MCV 91.6 92.9 91.1 93.2 93.1  MCH 30.7 31.2 31.1 31.2 30.9  MCHC 33.6 33.6 34.1 33.5 33.2  RDW 14.9 15.0 15.0 15.2 15.5    Chemistries  Recent Labs  Lab 12/08/17 2257 12/09/17 0607 12/10/17 0426 12/11/17 0235 12/11/17 1517 12/12/17 0613  NA  --  136 137 141 144 145  K  --  4.3 3.4* 2.9* 3.5 3.1*  CL  --  102 103 111 113* 110  CO2  --  15* 18* 16* 18* 22  GLUCOSE  --  135* 105* 117* 119* 132*  BUN  --  43* 58* 55* 50* 40*  CREATININE  --  3.98* 4.53* 3.54* 2.97* 2.31*  CALCIUM  --  8.3* 8.1* 7.6* 7.7* 8.0*  MG 1.7  --   --   --   --  1.6*  AST  --  111* 80* 61*  --   --   ALT  --  37 35 33  --   --   ALKPHOS  --  67 55 57  --   --   BILITOT  --  4.9* 3.6* 3.6*  --   --    ------------------------------------------------------------------------------------------------------------------ No results for input(s): CHOL, HDL, LDLCALC, TRIG, CHOLHDL, LDLDIRECT in the last 72 hours.  Lab Results  Component Value Date   HGBA1C 5.9 10/08/2015   ------------------------------------------------------------------------------------------------------------------ No results for input(s): TSH, T4TOTAL, T3FREE, THYROIDAB in the last 72  hours.  Invalid input(s): FREET3 ------------------------------------------------------------------------------------------------------------------ Recent Labs    12/11/17 1517  VITAMINB12 934*  FOLATE 6.9  FERRITIN 1,002*  TIBC 162*  IRON 66  RETICCTPCT 2.0    Coagulation profile No results for input(s): INR, PROTIME in the last 168 hours.  No results for input(s): DDIMER in the last 72 hours.  Cardiac Enzymes Recent Labs  Lab 12/08/17 2257 12/09/17 0607 12/09/17 1435 12/11/17 1517  CKMB  --   --   --  0.8  TROPONINI 0.05* 0.05* 0.04*  --    ------------------------------------------------------------------------------------------------------------------    Component Value Date/Time   BNP 976.0 (H) 12/08/2017 1913   BNP 336.8 (H) 08/21/2014 1624    Inpatient Medications  Scheduled Meds: . heparin injection (subcutaneous)  5,000 Units Subcutaneous Q8H   Continuous Infusions: . 0.9 % NaCl with KCl 20 mEq / L 100 mL/hr at 12/12/17 1127  . magnesium sulfate 1 - 4 g bolus IVPB    . potassium chloride     PRN Meds:.acetaminophen **OR** acetaminophen, ondansetron **OR** ondansetron (ZOFRAN) IV  Micro Results Recent Results (from the past 240 hour(s))  MRSA PCR Screening     Status: None  Collection Time: 12/08/17 10:44 PM  Result Value Ref Range Status   MRSA by PCR NEGATIVE NEGATIVE Final    Comment:        The GeneXpert MRSA Assay (FDA approved for NASAL specimens only), is one component of a comprehensive MRSA colonization surveillance program. It is not intended to diagnose MRSA infection nor to guide or monitor treatment for MRSA infections. Performed at [facility], [address], [place], [place] [address]   Culture, blood (Routine X 2) w Reflex to ID Panel     Status: None (Preliminary result)   Collection Time: 12/10/17  7:30 AM  Result Value Ref Range Status   Specimen Description BLOOD RIGHT ANTECUBITAL  Final   Special Requests    Final    BOTTLES DRAWN AEROBIC AND ANAEROBIC Blood Culture adequate volume   Culture   Final    NO GROWTH 2 DAYS Performed at [facility], [address] [place], [place] [address]    Report Status PENDING  Incomplete  Culture, blood (Routine X 2) w Reflex to ID Panel     Status: None (Preliminary result)   Collection Time: 12/10/17  7:45 AM  Result Value Ref Range Status   Specimen Description BLOOD RIGHT FOREARM  Final   Special Requests   Final    BOTTLES DRAWN AEROBIC AND ANAEROBIC Blood Culture results may not be optimal due to an excessive volume of blood received in culture bottles   Culture   Final    NO GROWTH 2 DAYS Performed at [facility], [address], [place], [place] [address]    Report Status PENDING  Incomplete    Radiology Reports Ct Abdomen Pelvis Wo Contrast  Result Date: 12/09/2017 CLINICAL DATA:  Nausea, projectile vomiting, gastric outlet obstruction question by radiographs, excessive diarrhea, hypertension, CHF, atrial fibrillation EXAM: CT ABDOMEN AND PELVIS WITHOUT CONTRAST TECHNIQUE: Multidetector CT imaging of the abdomen and pelvis was performed following the standard protocol without IV contrast. Sagittal and coronal MPR images reconstructed from axial data set. GI contrast administered. Scattered respiratory motion artifacts are present. COMPARISON:  None FINDINGS: Lower chest: Subsegmental atelectasis RIGHT lower lobe. LEFT lung base clear. Hepatobiliary: Marked fatty infiltration of liver. Gallbladder unremarkable. Pancreas: Normal appearance Spleen: Normal appearance Adrenals/Urinary Tract: Adrenal glands, kidneys, ureters, and bladder grossly normal appearance within the limitations imposed by motion artifacts Stomach/Bowel: Stool in rectum. Large and small bowel loops unremarkable. Nasogastric tube in stomach. Contrast and air within stomach without definite evidence of gastric mass or outlet obstruction. Vascular/Lymphatic: Atherosclerotic  calcifications aorta and iliac arteries as well as coronary arteries, including LEFT main coronary artery. Aorta normal caliber. No adenopathy. Reproductive: Unremarkable Other: Umbilical hernia containing fat. No definite free air or free fluid. Musculoskeletal: Diffuse osseous demineralization. No acute bony findings. Multilevel degenerative disc disease changes lower lumbar spine. Asymmetric enlargement and increased attenuation of the LEFT psoas muscle versus RIGHT compatible with intramuscular hematoma. IMPRESSION: Intramuscular hematoma of the LEFT psoas muscle which is asymmetrically enlarged and hyperdense versus RIGHT. Fatty infiltration of liver. No definite evidence of gastric outlet obstruction or gastric mass. Umbilical hernia containing fat. Scattered atherosclerotic calcifications of aorta, iliac arteries and coronary arteries including LEFT main coronary artery. Electronically Signed   By: Ulyses Southward M.D.   On: 12/09/2017 23:34   Nm Pulmonary Perf And Vent  Result Date: 12/09/2017 CLINICAL DATA:  Shortness of breath EXAM: NUCLEAR MEDICINE VENTILATION - PERFUSION LUNG SCAN VIEWS: Anterior, posterior, LPO, RPO, LAO,  RAO-ventilation and perfusion RADIOPHARMACEUTICALS:  31.2 mCi of Tc-77m DTPA aerosol inhalation and 4.2 mCi Tc29m-MAA IV COMPARISON:  Chest radiograph December 08, 2017 FINDINGS: Ventilation: Radiotracer uptake on the ventilation study is homogeneous and symmetric bilaterally. No ventilation defects are evident. There is elevation of the right hemidiaphragm, noted on chest radiograph. Perfusion: Radiotracer uptake is homogeneous and symmetric bilaterally. No perfusion defects are evident. There is elevation of the right hemidiaphragm. IMPRESSION: No appreciable ventilation or perfusion defect. This study constitutes a very low probability of pulmonary embolus. There is elevation of the right hemidiaphragm. Electronically Signed   By: Bretta Bang III M.D.   On: 12/09/2017 10:05    Dg Chest Portable 1 View  Result Date: 12/08/2017 CLINICAL DATA:  Short of breath and weakness since 630 today EXAM: PORTABLE CHEST 1 VIEW COMPARISON:  09/08/2015 FINDINGS: Mild cardiomegaly. Normal vascularity. Right hemidiaphragm remains elevated. Bibasilar atelectasis. IMPRESSION: Cardiomegaly without decompensation. Bibasilar atelectasis. Electronically Signed   By: Jolaine Click M.D.   On: 12/08/2017 19:43   Dg Abd Portable 1v  Result Date: 12/12/2017 CLINICAL DATA:  Vomiting for 2 days. EXAM: PORTABLE ABDOMEN - 1 VIEW COMPARISON:  CT scan 12/09/2017 FINDINGS: Air throughout the small bowel and colon with mild distension suggesting a diffuse ileus or gastroenteritis. No findings to suggest obstruction or perforation. Colonic interposition is noted. The visualized left lung base is grossly clear. There is some residual contrast in the rectum. IMPRESSION: Large amount of air throughout the stomach, small bowel and colon suggesting an ileus or gastroenteritis. No findings for obstruction or free air. Electronically Signed   By: Rudie Meyer M.D.   On: 12/12/2017 09:49   Dg Abd Portable 1v  Result Date: 12/09/2017 CLINICAL DATA:  Nausea and vomiting EXAM: PORTABLE ABDOMEN - 1 VIEW COMPARISON:  Abdominal ultrasound of December 09, 2017 FINDINGS: The stomach is moderately distended with air. No small or large bowel obstruction is observed. The stool burden does not appear excessive. There are no abnormal soft tissue calcifications. There are degenerative changes at the L3-4 and L4-5 disc levels. IMPRESSION: Moderate gaseous distention of the stomach may reflect gastric outlet obstruction. No evidence of a small or large bowel obstructive process. Electronically Signed   By: David  [place] M.D.   On: 12/09/2017 15:17   US Abdomen Limited Ruq  Result Date: 12/09/2017 CLINICAL DATA:  Hyperbilirubinemia EXAM: ULTRASOUND ABDOMEN LIMITED RIGHT UPPER QUADRANT COMPARISON:  None. FINDINGS: Gallbladder: Poor  visualization of the gallbladder which appears to be contracted and mildly thick-walled at 3 mm. Orientation of the gallbladder and patient body habitus made assessment problematic. Small nonshadowing filling defects in the gallbladder for example on image 5 potentially from tumefactive sludge. This was not interrogated by Doppler to assess for solid mass. Strictly speaking a gallbladder polyp could have a similar appearance for example on image 8. Sonographic Eulah Pont sign was absent.  No definite shadowing calculi. Common bile duct: Diameter: 4 mm Liver: Coarse echogenic liver with poor sonic penetration compatible with diffuse hepatic steatosis. Portal vein is patent on color Doppler imaging with normal direction of blood flow towards the liver. IMPRESSION: 1. Technically difficult exam due to orientation of the gallbladder and the patient's body habitus. Also the gallbladder appears somewhat contracted and mildly thick-walled, although sonographic Murphy sign was absent. There is some complex rounded filling defect in the gallbladder which is probably tumefactive sludge, along with several small nonshadowing intraluminal hyperechogenicies. The possibility of a polyp or small gallbladder mass is not excluded on today's  exam. Given the limited liver enzymes and constellation of findings, consider MRI/MRCP with and without contrast for further characterization. 2. Coarse echogenic liver with poor sonic penetration compatible with diffuse hepatic steatosis. Electronically Signed   By: Gaylyn Rong M.D.   On: 12/09/2017 12:50     Huey Bienenstock M.D on 12/12/2017 at 12:42 PM  Between 7am to 7pm - Pager - [phone]  After 7pm go to [email or web address] - password [facility]  [facility] -  Office  [phone]

## 2017-12-12 NOTE — Progress Notes (Signed)
Subjective: Mr. Tanner Gilbert was seen resting in his bed this morning with his son at bedside. The patient appears altered and seems to believe that I am his family member calling out "Tanner Gilbert". He was not oriented enough to answer objective questions. Patient's son expressed that the patient had always expressed that he would like [facility] [place] [facility] [place] if he had to ever be hospitalized beyond a week.  Objective: Vital signs in last 24 hours: Temp:  [97.8 F (36.6 C)-98.7 F (37.1 C)] 98.7 F (37.1 C) (09/14 0300) Pulse Rate:  [70-91] 70 (09/14 0300) Resp:  [16-24] 24 (09/14 0300) BP: (121-134)/(87-99) 134/93 (09/13 2300) SpO2:  [98 %-100 %] 100 % (09/13 2300) Weight:  [95.3 kg] 95.3 kg (09/14 0539) Weight change: 1 kg  Intake/Output from previous day: 09/13 0701 - 09/14 0700 In: 888.8 [I.V.:237.8; IV Piggyback:651] Out: -  Intake/Output this shift: No intake/output data recorded.  Physical Exam  Constitutional: He appears well-developed and well-nourished. He appears ill. He appears distressed.  tearful  HENT:  Head: Normocephalic and atraumatic.  Eyes: Conjunctivae are normal.  Cardiovascular: Normal rate, regular rhythm and normal heart sounds.  Respiratory: Effort normal and breath sounds normal. No respiratory distress. He has no wheezes.  GI: Soft. Bowel sounds are normal. He exhibits no distension. There is no tenderness.  Musculoskeletal: He exhibits no edema.  Neurological: He is alert.  Psychiatric: He has a normal mood and affect. His behavior is normal. Judgment and thought content normal.    Lab Results: Recent Labs    12/11/17 0235 12/12/17 0613  WBC 7.4 7.4  HGB 8.7* 9.0*  HCT 26.0* 27.1*  PLT 187 179   BMET:  Recent Labs    12/11/17 1517 12/12/17 0613  NA 144 145  K 3.5 3.1*  CL 113* 110  CO2 18* 22  GLUCOSE 119* 132*  BUN 50* 40*  CREATININE 2.97* 2.31*  CALCIUM 7.7* 8.0*   No results for input(s): PTH in the last 72 hours. Iron  Studies:  Recent Labs    12/11/17 1517  IRON 66  TIBC 162*  FERRITIN 1,002*   Studies/Results: No results found.  Assessment/Plan:  Oliguric acute on chronic kidney disease  Thought to be pre-renal etiology in setting of SIRS, but without known infectious source. Patient has no documented urine output in chart. Creatinine continuing to improve from 2.97 to 2.31. Bicarb=22.   Patient was getting sodium bicarb drip which can now be converted to NS.   Hypokalemia  Potassium of 3.1. With low magnesium as well at 1.6. Replete   Hypotension  Patient's blood pressure has been ranging 120-130s/80-90s over the past 24 hrs.   Normocytic Anemia  Hb=9.0, mcv=93, rdw=15.5. Continue to monitor as no intervention needed at this time.    LOS: 4 days   Ellissa Ayo 12/12/2017,7:50 AM

## 2017-12-12 NOTE — Plan of Care (Signed)
Attempted to discuss the plan of care for the evening shift.  Patient is very confused.  Patient's son tries to "mask" his father's confusion by repeatedly asking question until his father  says the correct answer.  No teach back displayed.

## 2017-12-12 NOTE — NC FL2 (Signed)
[place] MEDICAID FL2 LEVEL OF CARE SCREENING TOOL     IDENTIFICATION  Patient Name: Tanner Gilbert Birthdate: 1950-12-02 Sex: male Admission Date (Current Location): 12/08/2017  [facility] [place]   Facility and Address:  [facility] [place], [address], [place], [place] [address]      Provider Number: [id number]  Attending Physician Name and Address:  Elgergawy, Leana Roeawood S, MD  Relative Name and Phone Number:       Current Level of Care: Hospital Recommended Level of Care: Skilled Nursing Facility Prior Approval Number:    Date Approved/Denied:   PASRR Number: [id number]  Discharge Plan: SNF    Current Diagnoses: Patient Active Problem List   Diagnosis Date Noted  . Nausea and vomiting   . Hypotension   . SIRS (systemic inflammatory response syndrome) (HCC)   . Acute respiratory failure with hypoxia (HCC) 12/08/2017  . ARF (acute renal failure) (HCC) 12/08/2017  . Persistent atrial fibrillation (HCC)   . Acute on chronic systolic heart failure (HCC)   . Acute on chronic congestive heart failure (HCC)   . CHF (congestive heart failure) (HCC) 09/08/2015  . HTN (hypertension) with goal to be determined 09/08/2012  . Morbid obesity (HCC) 09/08/2012    Orientation RESPIRATION BLADDER Height & Weight     Self  Normal Incontinent, Indwelling catheter Weight: 210 lb 1.6 oz (95.3 kg) Height:  6' (182.9 cm)  BEHAVIORAL SYMPTOMS/MOOD NEUROLOGICAL BOWEL NUTRITION STATUS  (None) (None) Incontinent Diet(See discharge summary. Currently NPO.)  AMBULATORY STATUS COMMUNICATION OF NEEDS Skin   Extensive Assist Verbally Other (Comment)(Skin tear.)                       Personal Care Assistance Level of Assistance              Functional Limitations Info  Sight, Hearing, Speech Sight Info: Adequate Hearing Info: Adequate Speech Info: Adequate    SPECIAL CARE FACTORS FREQUENCY  PT (By licensed PT), Blood pressure      PT Frequency: 5 x week              Contractures Contractures Info: Not present    Additional Factors Info  Code Status, Allergies Code Status Info: Full code Allergies Info: Apixaban, Lisinopril.           Current Medications (12/12/2017):  This is the current hospital active medication list Current Facility-Administered Medications  Medication Dose Route Frequency Provider Last Rate Last Dose  . 0.9 % NaCl with KCl 20 mEq/ L  infusion   Intravenous Continuous Maxie BarbBhandari, Dron Prasad, MD      . acetaminophen (TYLENOL) tablet 650 mg  650 mg Oral Q6H PRN Eduard ClosKakrakandy, Arshad N, MD       Or  . acetaminophen (TYLENOL) suppository 650 mg  650 mg Rectal Q6H PRN Eduard ClosKakrakandy, Arshad N, MD      . heparin injection 5,000 Units  5,000 Units Subcutaneous Q8H Elgergawy, Leana Roeawood S, MD   5,000 Units at 12/12/17 0534  . hydrocortisone sodium succinate (SOLU-CORTEF) 100 MG injection 50 mg  50 mg Intravenous Q8H Elgergawy, Leana Roeawood S, MD   50 mg at 12/12/17 0534  . magnesium sulfate IVPB 2 g 50 mL  2 g Intravenous Once Maxie BarbBhandari, Dron Prasad, MD      . ondansetron [facility](ZOFRAN) tablet 4 mg  4 mg Oral Q6H PRN Eduard ClosKakrakandy, Arshad N, MD       Or  . ondansetron [facility](ZOFRAN) injection 4  mg  4 mg Intravenous Q6H PRN Eduard Clos, MD      . potassium chloride 10 mEq in 100 mL IVPB  10 mEq Intravenous Q1 Hr x 4 Maxie Barb, MD         Discharge Medications: Please see discharge summary for a list of discharge medications.  Relevant Imaging Results:  Relevant Lab Results:   Additional Information SS: [id number]  Margarito Liner, LCSW

## 2017-12-12 NOTE — Clinical Social Work Placement (Signed)
   CLINICAL SOCIAL WORK PLACEMENT  NOTE  Date:  12/12/2017  Patient Details  Name: Tanner Gilbert MRN: [id number] Date of Birth: [birth year]  Clinical Social Work is seeking post-discharge placement for this patient at the Skilled  Nursing Facility level of care (*CSW will initial, date and re-position this form in  chart as items are completed):  Yes   Patient/family provided with [facility]'s list of facilities offering this level of care within the geographic area requested by the patient (or if unable, by the patient's family).  Yes   Patient/family informed of their freedom to choose among providers that offer the needed level of care, that participate in Medicare, Medicaid or managed care program needed by the patient, have an available bed and are willing to accept the patient.  Yes   Patient/family informed of 's ownership interest in [facility] and [facility], as well as of the fact that they are under no obligation to receive care at these facilities.  PASRR submitted to EDS on 12/12/17     PASRR number received on 12/12/17     Existing PASRR number confirmed on       FL2 transmitted to all facilities in geographic area requested by pt/family on 12/12/17     FL2 transmitted to all facilities within larger geographic area on       Patient informed that his/her managed care company has contracts with or will negotiate with certain facilities, including the following:            Patient/family informed of bed offers received.  Patient chooses bed at       Physician recommends and patient chooses bed at      Patient to be transferred to   on  .  Patient to be transferred to facility by       Patient family notified on   of transfer.  Name of family member notified:        PHYSICIAN Please sign FL2     Additional Comment:    _______________________________________________ Margarito LinerSarah C Arvle Grabe, LCSW 12/12/2017,  10:55 AM

## 2017-12-13 DIAGNOSIS — I10 Essential (primary) hypertension: Secondary | ICD-10-CM

## 2017-12-13 LAB — FOLATE: FOLATE: 1.8 ng/mL — AB (ref >5.9)

## 2017-12-13 LAB — CBC
HEMATOCRIT: 29 % — AB (ref 39.0–52.0)
Hemoglobin: 9.4 g/dL — ABNORMAL LOW (ref 13.0–17.0)
MCH: 31.2 pg (ref 26.0–34.0)
MCHC: 32.4 g/dL (ref 30.0–36.0)
MCV: 96.3 fL (ref 78.0–100.0)
Platelets: 213 10*3/uL (ref 150–400)
RBC: 3.01 MIL/uL — ABNORMAL LOW (ref 4.22–5.81)
RDW: 15.7 % — AB (ref 11.5–15.5)
WBC: 10.2 10*3/uL (ref 4.0–10.5)

## 2017-12-13 LAB — RENAL FUNCTION PANEL
Albumin: 2.4 g/dL — ABNORMAL LOW (ref 3.5–5.0)
Anion gap: 9 (ref 5–15)
BUN: 30 mg/dL — ABNORMAL HIGH (ref 8–23)
CHLORIDE: 112 mmol/L — AB (ref 98–111)
CO2: 27 mmol/L (ref 22–32)
Calcium: 8.4 mg/dL — ABNORMAL LOW (ref 8.9–10.3)
Creatinine, Ser: 1.59 mg/dL — ABNORMAL HIGH (ref 0.61–1.24)
GFR calc Af Amer: 50 mL/min — ABNORMAL LOW (ref >60)
GFR, EST NON AFRICAN AMERICAN: 43 mL/min — AB (ref >60)
Glucose, Bld: 115 mg/dL — ABNORMAL HIGH (ref 70–99)
Phosphorus: 1.9 mg/dL — ABNORMAL LOW (ref 2.5–4.6)
Potassium: 3.1 mmol/L — ABNORMAL LOW (ref 3.5–5.1)
Sodium: 148 mmol/L — ABNORMAL HIGH (ref 135–145)

## 2017-12-13 LAB — MAGNESIUM: MAGNESIUM: 1.9 mg/dL (ref 1.7–2.4)

## 2017-12-13 LAB — AMMONIA: Ammonia: 24 umol/L (ref 9–35)

## 2017-12-13 LAB — VITAMIN B12: Vitamin B-12: 1022 pg/mL — ABNORMAL HIGH (ref 180–914)

## 2017-12-13 MED ORDER — SODIUM CHLORIDE 0.45 % IV SOLN
INTRAVENOUS | Status: DC
  Administered 2017-12-13 – 2017-12-15 (×2): via INTRAVENOUS
  Filled 2017-12-13 (×4): qty 1000

## 2017-12-13 MED ORDER — SODIUM CHLORIDE 0.9 % IV SOLN
1.0000 mg | Freq: Once | INTRAVENOUS | Status: AC
  Administered 2017-12-13: 1 mg via INTRAVENOUS
  Filled 2017-12-13: qty 0.2

## 2017-12-13 MED ORDER — POTASSIUM CHLORIDE 10 MEQ/100ML IV SOLN
10.0000 meq | INTRAVENOUS | Status: AC
  Administered 2017-12-13 (×4): 10 meq via INTRAVENOUS
  Filled 2017-12-13 (×4): qty 100

## 2017-12-13 MED ORDER — CARVEDILOL 12.5 MG PO TABS
12.5000 mg | ORAL_TABLET | Freq: Two times a day (BID) | ORAL | Status: DC
  Administered 2017-12-13 – 2017-12-15 (×4): 12.5 mg via ORAL
  Filled 2017-12-13 (×5): qty 1

## 2017-12-13 MED ORDER — FOLIC ACID 1 MG PO TABS
2.0000 mg | ORAL_TABLET | Freq: Every day | ORAL | Status: DC
  Administered 2017-12-14 – 2017-12-16 (×3): 2 mg via ORAL
  Filled 2017-12-13 (×3): qty 2

## 2017-12-13 MED ORDER — ENSURE ENLIVE PO LIQD
237.0000 mL | Freq: Two times a day (BID) | ORAL | Status: DC
  Administered 2017-12-13 – 2017-12-16 (×7): 237 mL via ORAL

## 2017-12-13 MED ORDER — PRO-STAT SUGAR FREE PO LIQD
30.0000 mL | Freq: Two times a day (BID) | ORAL | Status: DC
  Administered 2017-12-13 – 2017-12-16 (×7): 30 mL via ORAL
  Filled 2017-12-13 (×6): qty 30

## 2017-12-13 NOTE — Progress Notes (Signed)
Subjective: Interval History: Patient doing well this AM. Patient and son seem to not quite understand what is going on. Asking about death and stating patient does not want life prolonging measures. Unsure about urine output. Discussed that renal function appears to be improving. Will continue to monitor.   Objective: Vital signs in last 24 hours: Temp:  [97.5 F (36.4 C)-98.4 F (36.9 C)] 97.9 F (36.6 C) (09/15 0810) Pulse Rate:  [94-104] 104 (09/15 0810) Resp:  [17-23] 19 (09/15 0810) BP: (132-145)/(93-103) 141/103 (09/15 0810) SpO2:  [92 %-97 %] 97 % (09/15 0810) Weight change:   Intake/Output from previous day: 09/14 0701 - 09/15 0700 In: 1896.5 [P.O.:480; I.V.:1236.5; IV Piggyback:180] Out: 325 [Urine:325]   Intake/Output this shift: No intake/output data recorded.  General: Well nourished male in no acute distress Pulm: Good air movement with no wheezing or crackles  CV: RRR, no murmurs, no rubs  Abdomen: Soft, non-distended, no tenderness to palpation  Extremities: No LE edema   Lab Results: Recent Labs    12/11/17 0235 12/12/17 0613  WBC 7.4 7.4  HGB 8.7* 9.0*  HCT 26.0* 27.1*  PLT 187 179   BMET:  Recent Labs    12/11/17 1517 12/12/17 0613  NA 144 145  K 3.5 3.1*  CL 113* 110  CO2 18* 22  GLUCOSE 119* 132*  BUN 50* 40*  CREATININE 2.97* 2.31*  CALCIUM 7.7* 8.0*   No results for input(s): PTH in the last 72 hours.   Iron Studies:  Recent Labs    12/11/17 1517  IRON 66  TIBC 162*  FERRITIN 1,002*   Studies/Results: Dg Abd Portable 1v  Result Date: 12/12/2017 CLINICAL DATA:  Vomiting for 2 days. EXAM: PORTABLE ABDOMEN - 1 VIEW COMPARISON:  CT scan 12/09/2017 FINDINGS: Air throughout the small bowel and colon with mild distension suggesting a diffuse ileus or gastroenteritis. No findings to suggest obstruction or perforation. Colonic interposition is noted. The visualized left lung base is grossly clear. There is some residual contrast in the  rectum. IMPRESSION: Large amount of air throughout the stomach, small bowel and colon suggesting an ileus or gastroenteritis. No findings for obstruction or free air. Electronically Signed   By: Rudie MeyerP.  Gallerani M.D.   On: 12/12/2017 09:49   Scheduled: . heparin injection (subcutaneous)  5,000 Units Subcutaneous Q8H   Assessment/Plan:  AKI on CKD, oliguric  - Patient presented with hypovolemia and hypotension leading to pre-renal renal insult.  - Creatinine improved to 2.31 today from peak of 4.53  - Urine output recorded at 325 mL, family reports more. Dark urine in bag  - Labs reviewed from 9/14.  - Renal function appears to be recovering, but discrepancy in urine output. Strict I&O's. May need to increase IV hydration   NAGMA - Bicarb improved to 22   Normocytic Anemia  - Hgb 9.0 from baseline of ~12  - Iron sat 41 and ferritin 1002 - Likely anemia of chronic disease  Will discuss case further with Dr. Ronalee BeltsBhandari.     LOS: 5 days   [facility] BurdickJustin Dominiqua Cooner 12/13/2017,8:30 AM

## 2017-12-13 NOTE — Progress Notes (Signed)
PROGRESS NOTE                                                                                                                                                                                                             Patient Demographics:    Tanner Gilbert, is a 67 y.o. male, DOB - Jul 26, 1950, ZOX:[id number]  Admit date - 12/08/2017   Admitting Physician Eduard Clos, MD  Outpatient Primary MD for the patient is Copland, Gwenlyn Found, MD  LOS - 5   Chief Complaint  Patient presents with  . Shortness of Breath       Brief Narrative    67 y.o. male with history of atrial fibrillation, chronic systolic heart failure last EF measured in 2017 was 40 to 45%, hypertension was brought to the ER because of shortness of breath, initial suspicion was for acute on chronic systolic CHF, PE ruled out with VQ scan, as well he was noted to have acute on chronic renal failure, hypotensive, with projectile vomiting, initially on heparin GTT for anticoagulation, stopped with negative VQ scan, and hematoma in the psoas muscle, CT abdomen pelvis with no acute findings, creatinine improving with IV fluids.   Subjective:    [name] today with no further nausea or vomiting after yesterday's episode, had 2 episodes of bowel movement this morning as well.   Assessment  & Plan :    Principal Problem:   Acute respiratory failure with hypoxia (HCC) Active Problems:   HTN (hypertension) with goal to be determined   Acute on chronic systolic heart failure (HCC)   Persistent atrial fibrillation (HCC)   ARF (acute renal failure) (HCC)   Nausea and vomiting   Hypotension   SIRS (systemic inflammatory response syndrome) (HCC)   SIRS -Patient presents with tachycardia, hypotension, leukocytosis, as well having elevated procalcitonin, -Infectious etiology could be found so far on CT abdomen pelvis, chest x-ray with no pneumonia, negative urine analysis, but he  did have elevated procalcitonin, blood cultures remain negative, he was empirically on IV Zosyn, procalcitonin trending down, blood pressure has improved, IV Zosyn stopped 12/12/2017. -We will stress dose hydrocortisone as well. -Continue with IV fluids  Acute renal failure on CKD -In the setting of ATN from volume depletion and hypotension, with concurrent use of Lasix at home, currently improved, back to baseline with IV  fluids, baseline around 1.5, peaked at 4.5, now back to baseline . - no evidence of retention or hydronephrosis on imaging . -Attempt voiding trial tomorrow -Renal input appreciated   Psoas hematoma -Hemoglobin overall stable, dropped slightly secondary to fluid resuscitation, hold anti-coagulation for now  Acute respiratory failure with hypoxia  -Thought to be secondary to acute on chronic systolic CHF, EF 40 to 45% in 9147, initially requiring BiPAP, significantly improved with IV diuresis active admission. -Currently on 2 L nasal cannula with no evidence of volume overload after diuresis. -VQ scan negative for PE .  Nausea and vomiting -Patient with multiple projectile vomiting on admission, x-ray initially suspicious for gastric outlet obstruction, but none could be found on CT abdomen pelvis, having good bowel movements, was kept on NGT suction, advance to clear liquid diet, which he tolerated, yesterday he had an episode of vomiting, KUB significant for ileus, repleted electrolyte, he is tolerating clear liquid diet which was initiated yesterday, encouraged to ambulate, monitor BMP and magnesium closely and replete electrolytes. -GI input greatly appreciated  Hypokalemia/hypomagnesemia  -Repleting  Folate deficiency -B 12 within normal limits, will give 1 mg of IV folate today, then 2 mg from tomorrow oral  Malnutrition -Low albumin, poor oral intake, started on supplements  Acute on chronic systolic CHF -Felt to be volume overloaded on admission, was on IV  diuresis, last echo 2017, with EF 40 to 45%, repeat echo this admission showing improved EF at 60% . -Blood pressure has improved, he is with tachycardia again today, so we will resume on low-dose Coreg , not ACE or are appropriate in the setting of renal failure, resume BiDil when blood pressure improves.  History of atrial fibrillation  -With RVR on admission, he has been on hold given hypotension, now blood pressure improved, will resume on Coreg, uptitrate as tolerated. -He is on Xarelto at home, on hold given worsening renal function, heparin drip, currently on hold, especially in the setting of psoas hematoma.  Elevated LFTs -He has benign abdominal exam, some nonspecific finding on right upper quadrant ultrasound, gallbladder unremarkable on ultrasound, trending down, continue to monitor.  Anemia of chronic disease -No evidence of overt GI bleed, living drop most likely due to delutional effect, anemia panel significant for anemia of chronic disease   Code Status : Full  Family Communication  : Son at bedside  Disposition Plan  : remains in stepdown, PT recommended SNF, but patient wants to go home  Consults  :  Renal, cardiology,PCCM, GI  Procedures  : none  DVT Prophylaxis  :  SCD, subcu heparin  Lab Results  Component Value Date   PLT 213 12/13/2017    Antibiotics  :    Anti-infectives (From admission, onward)   Start     Dose/Rate Route Frequency Ordered Stop   12/09/17 1800  piperacillin-tazobactam (ZOSYN) IVPB 3.375 g  Status:  Discontinued     3.375 g 12.5 mL/hr over 240 Minutes Intravenous Every 8 hours 12/09/17 1648 12/09/17 1736   12/09/17 1737  piperacillin-tazobactam (ZOSYN) IVPB 2.25 g  Status:  Discontinued     2.25 g 100 mL/hr over 30 Minutes Intravenous Every 8 hours 12/09/17 1736 12/12/17 0719        Objective:   Vitals:   12/13/17 0300 12/13/17 0315 12/13/17 0800 12/13/17 0810  BP: (!) 140/93  (!) 141/103 (!) 141/103  Pulse: 99  (!) 107 (!)  104  Resp: 17   19  Temp:  98 F (36.7 C)  97.9 F (36.6 C)  TempSrc:  Axillary  Oral  SpO2: 95%  96% 97%  Weight:      Height:        Wt Readings from Last 3 Encounters:  12/12/17 95.3 kg  10/10/15 134.1 kg  10/08/15 135.4 kg     Intake/Output Summary (Last 24 hours) at 12/13/2017 1158 Last data filed at 12/13/2017 0944 Gross per 24 hour  Intake 2229.76 ml  Output 325 ml  Net 1904.76 ml     Physical Exam  Awake Alert, frail, more awake and appropriate today Symmetrical Chest wall movement, Good air movement bilaterally, CTAB RRR,No Gallops,Rubs or new Murmurs, No Parasternal Heave +ve B.Sounds, Abd Soft, No tenderness, No rebound - guarding or rigidity. No Cyanosis, Clubbing or edema, No new Rash or bruise     Data Review:    CBC Recent Labs  Lab 12/09/17 1952 12/10/17 0426 12/11/17 0235 12/12/17 0613 12/13/17 0849  WBC 10.3 10.9* 7.4 7.4 10.2  HGB 10.1* 10.1* 8.7* 9.0* 9.4*  HCT 30.1* 29.6* 26.0* 27.1* 29.0*  PLT 212 219 187 179 213  MCV 92.9 91.1 93.2 93.1 96.3  MCH 31.2 31.1 31.2 30.9 31.2  MCHC 33.6 34.1 33.5 33.2 32.4  RDW 15.0 15.0 15.2 15.5 15.7*    Chemistries  Recent Labs  Lab 12/08/17 2257 12/09/17 0607 12/10/17 0426 12/11/17 0235 12/11/17 1517 12/12/17 0613 12/12/17 1318 12/13/17 0849  NA  --  136 137 141 144 145  --  148*  K  --  4.3 3.4* 2.9* 3.5 3.1*  --  3.1*  CL  --  102 103 111 113* 110  --  112*  CO2  --  15* 18* 16* 18* 22  --  27  GLUCOSE  --  135* 105* 117* 119* 132*  --  115*  BUN  --  43* 58* 55* 50* 40*  --  30*  CREATININE  --  3.98* 4.53* 3.54* 2.97* 2.31*  --  1.59*  CALCIUM  --  8.3* 8.1* 7.6* 7.7* 8.0*  --  8.4*  MG 1.7  --   --   --   --  1.6*  --  1.9  AST  --  111* 80* 61*  --   --  99*  --   ALT  --  37 35 33  --   --  45*  --   ALKPHOS  --  67 55 57  --   --  58  --   BILITOT  --  4.9* 3.6* 3.6*  --   --  3.6*  --     ------------------------------------------------------------------------------------------------------------------ No results for input(s): CHOL, HDL, LDLCALC, TRIG, CHOLHDL, LDLDIRECT in the last 72 hours.  Lab Results  Component Value Date   HGBA1C 5.9 10/08/2015   ------------------------------------------------------------------------------------------------------------------ No results for input(s): TSH, T4TOTAL, T3FREE, THYROIDAB in the last 72 hours.  Invalid input(s): FREET3 ------------------------------------------------------------------------------------------------------------------ Recent Labs    12/11/17 1517 12/13/17 0849  VITAMINB12 934* 1,022*  FOLATE 6.9 1.8*  FERRITIN 1,002*  --   TIBC 162*  --   IRON 66  --   RETICCTPCT 2.0  --     Coagulation profile No results for input(s): INR, PROTIME in the last 168 hours.  No results for input(s): DDIMER in the last 72 hours.  Cardiac Enzymes Recent Labs  Lab 12/08/17 2257 12/09/17 0607 12/09/17 1435 12/11/17 1517  CKMB  --   --   --  0.8  TROPONINI 0.05* 0.05* 0.04*  --    ------------------------------------------------------------------------------------------------------------------  Component Value Date/Time   BNP 976.0 (H) 12/08/2017 1913   BNP 336.8 (H) 08/21/2014 1624    Inpatient Medications  Scheduled Meds: . heparin injection (subcutaneous)  5,000 Units Subcutaneous Q8H   Continuous Infusions: . 0.9 % NaCl with KCl 20 mEq / L 125 mL/hr at 12/13/17 0944  . potassium chloride 10 mEq (12/13/17 1146)   PRN Meds:.acetaminophen **OR** acetaminophen, ondansetron **OR** ondansetron (ZOFRAN) IV  Micro Results Recent Results (from the past 240 hour(s))  MRSA PCR Screening     Status: None   Collection Time: 12/08/17 10:44 PM  Result Value Ref Range Status   MRSA by PCR NEGATIVE NEGATIVE Final    Comment:        The GeneXpert MRSA Assay (FDA approved for NASAL specimens only), is one  component of a comprehensive MRSA colonization surveillance program. It is not intended to diagnose MRSA infection nor to guide or monitor treatment for MRSA infections. Performed at [facility], [address] [place], [place] [address]   Culture, blood (Routine X 2) w Reflex to ID Panel     Status: None (Preliminary result)   Collection Time: 12/10/17  7:30 AM  Result Value Ref Range Status   Specimen Description BLOOD RIGHT ANTECUBITAL  Final   Special Requests   Final    BOTTLES DRAWN AEROBIC AND ANAEROBIC Blood Culture adequate volume   Culture   Final    NO GROWTH 2 DAYS Performed at [facility], [address] [place], [place] [address]    Report Status PENDING  Incomplete  Culture, blood (Routine X 2) w Reflex to ID Panel     Status: None (Preliminary result)   Collection Time: 12/10/17  7:45 AM  Result Value Ref Range Status   Specimen Description BLOOD RIGHT FOREARM  Final   Special Requests   Final    BOTTLES DRAWN AEROBIC AND ANAEROBIC Blood Culture results may not be optimal due to an excessive volume of blood received in culture bottles   Culture   Final    NO GROWTH 2 DAYS Performed at [facility], [address] [place], [place] [address]    Report Status PENDING  Incomplete    Radiology Reports Ct Abdomen Pelvis Wo Contrast  Result Date: 12/09/2017 CLINICAL DATA:  Nausea, projectile vomiting, gastric outlet obstruction question by radiographs, excessive diarrhea, hypertension, CHF, atrial fibrillation EXAM: CT ABDOMEN AND PELVIS WITHOUT CONTRAST TECHNIQUE: Multidetector CT imaging of the abdomen and pelvis was performed following the standard protocol without IV contrast. Sagittal and coronal MPR images reconstructed from axial data set. GI contrast administered. Scattered respiratory motion artifacts are present. COMPARISON:  None FINDINGS: Lower chest: Subsegmental atelectasis RIGHT lower lobe. LEFT lung base clear. Hepatobiliary: Marked  fatty infiltration of liver. Gallbladder unremarkable. Pancreas: Normal appearance Spleen: Normal appearance Adrenals/Urinary Tract: Adrenal glands, kidneys, ureters, and bladder grossly normal appearance within the limitations imposed by motion artifacts Stomach/Bowel: Stool in rectum. Large and small bowel loops unremarkable. Nasogastric tube in stomach. Contrast and air within stomach without definite evidence of gastric mass or outlet obstruction. Vascular/Lymphatic: Atherosclerotic calcifications aorta and iliac arteries as well as coronary arteries, including LEFT main coronary artery. Aorta normal caliber. No adenopathy. Reproductive: Unremarkable Other: Umbilical hernia containing fat. No definite free air or free fluid. Musculoskeletal: Diffuse osseous demineralization. No acute bony findings. Multilevel degenerative disc disease changes lower lumbar spine. Asymmetric enlargement and increased attenuation of the LEFT psoas muscle versus RIGHT compatible with intramuscular hematoma.  IMPRESSION: Intramuscular hematoma of the LEFT psoas muscle which is asymmetrically enlarged and hyperdense versus RIGHT. Fatty infiltration of liver. No definite evidence of gastric outlet obstruction or gastric mass. Umbilical hernia containing fat. Scattered atherosclerotic calcifications of aorta, iliac arteries and coronary arteries including LEFT main coronary artery. Electronically Signed   By: Ulyses Southward M.D.   On: 12/09/2017 23:34   Nm Pulmonary Perf And Vent  Result Date: 12/09/2017 CLINICAL DATA:  Shortness of breath EXAM: NUCLEAR MEDICINE VENTILATION - PERFUSION LUNG SCAN VIEWS: Anterior, posterior, LPO, RPO, LAO, RAO-ventilation and perfusion RADIOPHARMACEUTICALS:  31.2 mCi of Tc-57m DTPA aerosol inhalation and 4.2 mCi Tc50m-MAA IV COMPARISON:  Chest radiograph December 08, 2017 FINDINGS: Ventilation: Radiotracer uptake on the ventilation study is homogeneous and symmetric bilaterally. No ventilation defects  are evident. There is elevation of the right hemidiaphragm, noted on chest radiograph. Perfusion: Radiotracer uptake is homogeneous and symmetric bilaterally. No perfusion defects are evident. There is elevation of the right hemidiaphragm. IMPRESSION: No appreciable ventilation or perfusion defect. This study constitutes a very low probability of pulmonary embolus. There is elevation of the right hemidiaphragm. Electronically Signed   By: Bretta Bang III M.D.   On: 12/09/2017 10:05   Dg Chest Portable 1 View  Result Date: 12/08/2017 CLINICAL DATA:  Short of breath and weakness since 630 today EXAM: PORTABLE CHEST 1 VIEW COMPARISON:  09/08/2015 FINDINGS: Mild cardiomegaly. Normal vascularity. Right hemidiaphragm remains elevated. Bibasilar atelectasis. IMPRESSION: Cardiomegaly without decompensation. Bibasilar atelectasis. Electronically Signed   By: Jolaine Click M.D.   On: 12/08/2017 19:43   Dg Abd Portable 1v  Result Date: 12/12/2017 CLINICAL DATA:  Vomiting for 2 days. EXAM: PORTABLE ABDOMEN - 1 VIEW COMPARISON:  CT scan 12/09/2017 FINDINGS: Air throughout the small bowel and colon with mild distension suggesting a diffuse ileus or gastroenteritis. No findings to suggest obstruction or perforation. Colonic interposition is noted. The visualized left lung base is grossly clear. There is some residual contrast in the rectum. IMPRESSION: Large amount of air throughout the stomach, small bowel and colon suggesting an ileus or gastroenteritis. No findings for obstruction or free air. Electronically Signed   By: Rudie Meyer M.D.   On: 12/12/2017 09:49   Dg Abd Portable 1v  Result Date: 12/09/2017 CLINICAL DATA:  Nausea and vomiting EXAM: PORTABLE ABDOMEN - 1 VIEW COMPARISON:  Abdominal ultrasound of December 09, 2017 FINDINGS: The stomach is moderately distended with air. No small or large bowel obstruction is observed. The stool burden does not appear excessive. There are no abnormal soft tissue  calcifications. There are degenerative changes at the L3-4 and L4-5 disc levels. IMPRESSION: Moderate gaseous distention of the stomach may reflect gastric outlet obstruction. No evidence of a small or large bowel obstructive process. Electronically Signed   By: David  [place] M.D.   On: 12/09/2017 15:17   US Abdomen Limited Ruq  Result Date: 12/09/2017 CLINICAL DATA:  Hyperbilirubinemia EXAM: ULTRASOUND ABDOMEN LIMITED RIGHT UPPER QUADRANT COMPARISON:  None. FINDINGS: Gallbladder: Poor visualization of the gallbladder which appears to be contracted and mildly thick-walled at 3 mm. Orientation of the gallbladder and patient body habitus made assessment problematic. Small nonshadowing filling defects in the gallbladder for example on image 5 potentially from tumefactive sludge. This was not interrogated by Doppler to assess for solid mass. Strictly speaking a gallbladder polyp could have a similar appearance for example on image 8. Sonographic Eulah Pont sign was absent.  No definite shadowing calculi. Common bile duct: Diameter: 4 mm Liver: Coarse echogenic  liver with poor sonic penetration compatible with diffuse hepatic steatosis. Portal vein is patent on color Doppler imaging with normal direction of blood flow towards the liver. IMPRESSION: 1. Technically difficult exam due to orientation of the gallbladder and the patient's body habitus. Also the gallbladder appears somewhat contracted and mildly thick-walled, although sonographic Murphy sign was absent. There is some complex rounded filling defect in the gallbladder which is probably tumefactive sludge, along with several small nonshadowing intraluminal hyperechogenicies. The possibility of a polyp or small gallbladder mass is not excluded on today's exam. Given the limited liver enzymes and constellation of findings, consider MRI/MRCP with and without contrast for further characterization. 2. Coarse echogenic liver with poor sonic penetration compatible with  diffuse hepatic steatosis. Electronically Signed   By: Gaylyn Rong M.D.   On: 12/09/2017 12:50     Huey Bienenstock M.D on 12/13/2017 at 11:58 AM  Between 7am to 7pm - Pager - [phone]  After 7pm go to [email or web address] - password [facility]  [facility] -  Office  [phone]

## 2017-12-14 LAB — COMPREHENSIVE METABOLIC PANEL
ALT: 51 U/L — AB (ref 0–44)
ANION GAP: 6 (ref 5–15)
AST: 99 U/L — ABNORMAL HIGH (ref 15–41)
Albumin: 2 g/dL — ABNORMAL LOW (ref 3.5–5.0)
Alkaline Phosphatase: 59 U/L (ref 38–126)
BILIRUBIN TOTAL: 2.7 mg/dL — AB (ref 0.3–1.2)
BUN: 25 mg/dL — ABNORMAL HIGH (ref 8–23)
CALCIUM: 8.3 mg/dL — AB (ref 8.9–10.3)
CO2: 27 mmol/L (ref 22–32)
CREATININE: 1.41 mg/dL — AB (ref 0.61–1.24)
Chloride: 114 mmol/L — ABNORMAL HIGH (ref 98–111)
GFR, EST AFRICAN AMERICAN: 58 mL/min — AB (ref >60)
GFR, EST NON AFRICAN AMERICAN: 50 mL/min — AB (ref >60)
Glucose, Bld: 114 mg/dL — ABNORMAL HIGH (ref 70–99)
Potassium: 3.7 mmol/L (ref 3.5–5.1)
Sodium: 147 mmol/L — ABNORMAL HIGH (ref 135–145)
TOTAL PROTEIN: 5.2 g/dL — AB (ref 6.5–8.1)

## 2017-12-14 LAB — CBC
HEMATOCRIT: 26.6 % — AB (ref 39.0–52.0)
Hemoglobin: 8.4 g/dL — ABNORMAL LOW (ref 13.0–17.0)
MCH: 30.7 pg (ref 26.0–34.0)
MCHC: 31.6 g/dL (ref 30.0–36.0)
MCV: 97.1 fL (ref 78.0–100.0)
Platelets: 147 10*3/uL — ABNORMAL LOW (ref 150–400)
RBC: 2.74 MIL/uL — AB (ref 4.22–5.81)
RDW: 15.9 % — ABNORMAL HIGH (ref 11.5–15.5)
WBC: 7.9 10*3/uL (ref 4.0–10.5)

## 2017-12-14 MED ORDER — POTASSIUM CL IN DEXTROSE 5% 20 MEQ/L IV SOLN
20.0000 meq | INTRAVENOUS | Status: DC
  Administered 2017-12-14: 20 meq via INTRAVENOUS
  Filled 2017-12-14: qty 1000

## 2017-12-14 MED ORDER — POTASSIUM CHLORIDE 10 MEQ/100ML IV SOLN
10.0000 meq | INTRAVENOUS | Status: AC
  Administered 2017-12-14 (×3): 10 meq via INTRAVENOUS
  Filled 2017-12-14 (×3): qty 100

## 2017-12-14 NOTE — Clinical Social Work Note (Signed)
CSW provided bed offers to son at bedside. Pt's son has agreed on [place]. Per MD if pt medically stable tomorrow pt can d/c. Pt's son made aware.   Alcan BorderBridget Ernesha Ramone, [place] [phone]

## 2017-12-14 NOTE — Progress Notes (Signed)
PROGRESS NOTE                                                                                                                                                                                                             Patient Demographics:    Tanner Gilbert, is a 67 y.o. male, DOB - Dec 04, 1950, WJX:[id number]  Admit date - 12/08/2017   Admitting Physician Eduard Clos, MD  Outpatient Primary MD for the patient is Copland, Gwenlyn Found, MD  LOS - 6   Chief Complaint  Patient presents with  . Shortness of Breath       Brief Narrative    67 y.o. male with history of atrial fibrillation, chronic systolic heart failure last EF measured in 2017 was 40 to 45%, hypertension was brought to the ER because of shortness of breath, initial suspicion was for acute on chronic systolic CHF, PE ruled out with VQ scan, as well he was noted to have acute on chronic renal failure, hypotensive, with projectile vomiting, initially on heparin GTT for anticoagulation, stopped with negative VQ scan, and hematoma in the psoas muscle, CT abdomen pelvis with no acute findings, creatinine improving with IV fluids.   Subjective:    Tanner Gilbert is any complaints today, no further episodes of nausea or vomiting , tolerating clear liquid diet .   Assessment  & Plan :    Principal Problem:   Acute respiratory failure with hypoxia (HCC) Active Problems:   HTN (hypertension) with goal to be determined   Acute on chronic systolic heart failure (HCC)   Persistent atrial fibrillation (HCC)   ARF (acute renal failure) (HCC)   Nausea and vomiting   Hypotension   SIRS (systemic inflammatory response syndrome) (HCC)   SIRS -Patient presents with tachycardia, hypotension, leukocytosis, as well having elevated procalcitonin, -Infectious etiology could be found so far on CT abdomen pelvis, chest x-ray with no pneumonia, negative urine analysis, but he did have elevated  procalcitonin, blood cultures remain negative, he was empirically on IV Zosyn, procalcitonin trending down, blood pressure has improved, IV Zosyn stopped 12/12/2017. -Currently off stress dose hydrocortisone -Continue with IV fluids  Acute renal failure on CKD -In the setting of ATN from volume depletion and hypotension, with concurrent use of Lasix at home, currently improved, back to baseline with IV fluids, baseline around 1.5, peaked  at 4.5, now back to baseline .  There was no evidence of retention or hydronephrosis on imaging, improving with IV fluids, will DC Foley catheter today. -Renal input appreciated   Psoas hematoma -Hemoglobin overall stable, dropped slightly secondary to fluid resuscitation, hold anti-coagulation for now  Acute respiratory failure with hypoxia  -Thought to be secondary to acute on chronic systolic CHF, EF 40 to 45% in 7253, initially requiring BiPAP, significantly improved with IV diuresis active admission. -Currently on 2 L nasal cannula with no evidence of volume overload after diuresis. -VQ scan negative for PE .  Nausea and vomiting -Patient with multiple projectile vomiting on admission, x-ray initially suspicious for gastric outlet obstruction, but none could be found on CT abdomen pelvis, having good bowel movements, was kept on NGT suction, advance to clear liquid diet, which he tolerated, developed either nausea or vomiting, KUB significant for ileus . -Clear liquid diet over last 24 hours, will advance to soft diet, encouraged to ambulate , getting electrolytes closely and replete thing as needed . -GI input greatly appreciated  Hypokalemia/hypomagnesemia  -Repleting  Folate deficiency -B 12 within normal limits, will give 1 mg of IV folate today, then 2 mg from tomorrow oral  Malnutrition -Low albumin, poor oral intake, started on supplements  Acute on chronic systolic CHF -Felt to be volume overloaded on admission, was on IV diuresis, last  echo 2017, with EF 40 to 45%, repeat echo this admission showing improved EF at 60% . -Blood pressure has improved, he is with tachycardia again today, so we will resume on low-dose Coreg , not ACE or are appropriate in the setting of renal failure, resume BiDil when blood pressure improves.  History of atrial fibrillation  -With RVR on admission, he has been on hold given hypotension, now blood pressure improved, will resume on Coreg, uptitrate as tolerated. -He is on Xarelto at home, on hold given worsening renal function, heparin drip, currently on hold, especially in the setting of psoas hematoma.  Elevated LFTs -He has benign abdominal exam, some nonspecific finding on right upper quadrant ultrasound, gallbladder unremarkable on ultrasound, trending down, continue to monitor.  Anemia of chronic disease -No evidence of overt GI bleed, living drop most likely due to delutional effect, anemia panel significant for anemia of chronic disease   Code Status : Full  Family Communication  : Son at bedside  Disposition Plan  : remains in stepdown, PT recommended SNF,   Consults  :  Renal, cardiology,PCCM, GI  Procedures  : none  DVT Prophylaxis  :  SCD, subcu heparin  Lab Results  Component Value Date   PLT 147 (L) 12/14/2017    Antibiotics  :    Anti-infectives (From admission, onward)   Start     Dose/Rate Route Frequency Ordered Stop   12/09/17 1800  piperacillin-tazobactam (ZOSYN) IVPB 3.375 g  Status:  Discontinued     3.375 g 12.5 mL/hr over 240 Minutes Intravenous Every 8 hours 12/09/17 1648 12/09/17 1736   12/09/17 1737  piperacillin-tazobactam (ZOSYN) IVPB 2.25 g  Status:  Discontinued     2.25 g 100 mL/hr over 30 Minutes Intravenous Every 8 hours 12/09/17 1736 12/12/17 0719        Objective:   Vitals:   12/13/17 2300 12/14/17 0300 12/14/17 0813 12/14/17 1222  BP: (!) 126/94 130/88 (!) 138/91 106/84  Pulse: 94 (!) 101 99 (!) 110  Resp: (!) 22 (!) 22 15 16   Temp:  97.7 F (36.5 C) 98.1 F (  36.7 C) 97.7 F (36.5 C)   TempSrc: Oral Oral Oral   SpO2: 100% 100% 98% 96%  Weight:      Height:        Wt Readings from Last 3 Encounters:  12/12/17 95.3 kg  10/10/15 134.1 kg  10/08/15 135.4 kg     Intake/Output Summary (Last 24 hours) at 12/14/2017 1250 Last data filed at 12/14/2017 1006 Gross per 24 hour  Intake 1426.77 ml  Output 850 ml  Net 576.77 ml     Physical Exam  Awake Alert, Oriented X 2, No new F.N deficits, Normal affect Symmetrical Chest wall movement, Good air movement bilaterally, CTAB RRR,No Gallops,Rubs or new Murmurs, No Parasternal Heave +ve B.Sounds, Abd Soft, No tenderness, No rebound - guarding or rigidity. No Cyanosis, Clubbing or edema, No new Rash or bruise      Data Review:    CBC Recent Labs  Lab 12/10/17 0426 12/11/17 0235 12/12/17 0613 12/13/17 0849 12/14/17 0210  WBC 10.9* 7.4 7.4 10.2 7.9  HGB 10.1* 8.7* 9.0* 9.4* 8.4*  HCT 29.6* 26.0* 27.1* 29.0* 26.6*  PLT 219 187 179 213 147*  MCV 91.1 93.2 93.1 96.3 97.1  MCH 31.1 31.2 30.9 31.2 30.7  MCHC 34.1 33.5 33.2 32.4 31.6  RDW 15.0 15.2 15.5 15.7* 15.9*    Chemistries  Recent Labs  Lab 12/08/17 2257 12/09/17 0607 12/10/17 0426 12/11/17 0235 12/11/17 1517 12/12/17 0613 12/12/17 1318 12/13/17 0849 12/14/17 0210  NA  --  136 137 141 144 145  --  148* 147*  K  --  4.3 3.4* 2.9* 3.5 3.1*  --  3.1* 3.7  CL  --  102 103 111 113* 110  --  112* 114*  CO2  --  15* 18* 16* 18* 22  --  27 27  GLUCOSE  --  135* 105* 117* 119* 132*  --  115* 114*  BUN  --  43* 58* 55* 50* 40*  --  30* 25*  CREATININE  --  3.98* 4.53* 3.54* 2.97* 2.31*  --  1.59* 1.41*  CALCIUM  --  8.3* 8.1* 7.6* 7.7* 8.0*  --  8.4* 8.3*  MG 1.7  --   --   --   --  1.6*  --  1.9  --   AST  --  111* 80* 61*  --   --  99*  --  99*  ALT  --  37 35 33  --   --  45*  --  51*  ALKPHOS  --  67 55 57  --   --  58  --  59  BILITOT  --  4.9* 3.6* 3.6*  --   --  3.6*  --  2.7*    ------------------------------------------------------------------------------------------------------------------ No results for input(s): CHOL, HDL, LDLCALC, TRIG, CHOLHDL, LDLDIRECT in the last 72 hours.  Lab Results  Component Value Date   HGBA1C 5.9 10/08/2015   ------------------------------------------------------------------------------------------------------------------ No results for input(s): TSH, T4TOTAL, T3FREE, THYROIDAB in the last 72 hours.  Invalid input(s): FREET3 ------------------------------------------------------------------------------------------------------------------ Recent Labs    12/11/17 1517 12/13/17 0849  VITAMINB12 934* 1,022*  FOLATE 6.9 1.8*  FERRITIN 1,002*  --   TIBC 162*  --   IRON 66  --   RETICCTPCT 2.0  --     Coagulation profile No results for input(s): INR, PROTIME in the last 168 hours.  No results for input(s): DDIMER in the last 72 hours.  Cardiac Enzymes Recent Labs  Lab 12/08/17 2257 12/09/17  [id number] 12/09/17 1435 12/11/17 1517  CKMB  --   --   --  0.8  TROPONINI 0.05* 0.05* 0.04*  --    ------------------------------------------------------------------------------------------------------------------    Component Value Date/Time   BNP 976.0 (H) 12/08/2017 1913   BNP 336.8 (H) 08/21/2014 1624    Inpatient Medications  Scheduled Meds: . carvedilol  12.5 mg Oral BID WC  . feeding supplement (ENSURE ENLIVE)  237 mL Oral BID BM  . feeding supplement (PRO-STAT SUGAR FREE 64)  30 mL Oral BID  . folic acid  2 mg Oral Daily  . heparin injection (subcutaneous)  5,000 Units Subcutaneous Q8H   Continuous Infusions: . dextrose 5 % with KCl 20 mEq / L    . potassium chloride 10 mEq (12/14/17 1218)  . sodium chloride 0.45 % with kcl 50 mL/hr at 12/14/17 0935   PRN Meds:.acetaminophen **OR** acetaminophen, ondansetron **OR** ondansetron (ZOFRAN) IV  Micro Results Recent Results (from the past 240 hour(s))  MRSA PCR  Screening     Status: None   Collection Time: 12/08/17 10:44 PM  Result Value Ref Range Status   MRSA by PCR NEGATIVE NEGATIVE Final    Comment:        The GeneXpert MRSA Assay (FDA approved for NASAL specimens only), is one component of a comprehensive MRSA colonization surveillance program. It is not intended to diagnose MRSA infection nor to guide or monitor treatment for MRSA infections. Performed at [facility], [address] [place], [place] [address]   Culture, blood (Routine X 2) w Reflex to ID Panel     Status: None (Preliminary result)   Collection Time: 12/10/17  7:30 AM  Result Value Ref Range Status   Specimen Description BLOOD RIGHT ANTECUBITAL  Final   Special Requests   Final    BOTTLES DRAWN AEROBIC AND ANAEROBIC Blood Culture adequate volume   Culture   Final    NO GROWTH 4 DAYS Performed at [facility], [address] [place], [place] [address]    Report Status PENDING  Incomplete  Culture, blood (Routine X 2) w Reflex to ID Panel     Status: None (Preliminary result)   Collection Time: 12/10/17  7:45 AM  Result Value Ref Range Status   Specimen Description BLOOD RIGHT FOREARM  Final   Special Requests   Final    BOTTLES DRAWN AEROBIC AND ANAEROBIC Blood Culture results may not be optimal due to an excessive volume of blood received in culture bottles   Culture   Final    NO GROWTH 4 DAYS Performed at [facility], [address] [place], [place] [address]    Report Status PENDING  Incomplete    Radiology Reports Ct Abdomen Pelvis Wo Contrast  Result Date: 12/09/2017 CLINICAL DATA:  Nausea, projectile vomiting, gastric outlet obstruction question by radiographs, excessive diarrhea, hypertension, CHF, atrial fibrillation EXAM: CT ABDOMEN AND PELVIS WITHOUT CONTRAST TECHNIQUE: Multidetector CT imaging of the abdomen and pelvis was performed following the standard protocol without IV contrast. Sagittal and coronal MPR images  reconstructed from axial data set. GI contrast administered. Scattered respiratory motion artifacts are present. COMPARISON:  None FINDINGS: Lower chest: Subsegmental atelectasis RIGHT lower lobe. LEFT lung base clear. Hepatobiliary: Marked fatty infiltration of liver. Gallbladder unremarkable. Pancreas: Normal appearance Spleen: Normal appearance Adrenals/Urinary Tract: Adrenal glands, kidneys, ureters, and bladder grossly normal appearance within the limitations imposed by motion artifacts Stomach/Bowel: Stool in rectum. Large and small bowel loops unremarkable. Nasogastric tube  in stomach. Contrast and air within stomach without definite evidence of gastric mass or outlet obstruction. Vascular/Lymphatic: Atherosclerotic calcifications aorta and iliac arteries as well as coronary arteries, including LEFT main coronary artery. Aorta normal caliber. No adenopathy. Reproductive: Unremarkable Other: Umbilical hernia containing fat. No definite free air or free fluid. Musculoskeletal: Diffuse osseous demineralization. No acute bony findings. Multilevel degenerative disc disease changes lower lumbar spine. Asymmetric enlargement and increased attenuation of the LEFT psoas muscle versus RIGHT compatible with intramuscular hematoma. IMPRESSION: Intramuscular hematoma of the LEFT psoas muscle which is asymmetrically enlarged and hyperdense versus RIGHT. Fatty infiltration of liver. No definite evidence of gastric outlet obstruction or gastric mass. Umbilical hernia containing fat. Scattered atherosclerotic calcifications of aorta, iliac arteries and coronary arteries including LEFT main coronary artery. Electronically Signed   By: Ulyses Southward M.D.   On: 12/09/2017 23:34   Nm Pulmonary Perf And Vent  Result Date: 12/09/2017 CLINICAL DATA:  Shortness of breath EXAM: NUCLEAR MEDICINE VENTILATION - PERFUSION LUNG SCAN VIEWS: Anterior, posterior, LPO, RPO, LAO, RAO-ventilation and perfusion RADIOPHARMACEUTICALS:  31.2 mCi  of Tc-54m DTPA aerosol inhalation and 4.2 mCi Tc14m-MAA IV COMPARISON:  Chest radiograph December 08, 2017 FINDINGS: Ventilation: Radiotracer uptake on the ventilation study is homogeneous and symmetric bilaterally. No ventilation defects are evident. There is elevation of the right hemidiaphragm, noted on chest radiograph. Perfusion: Radiotracer uptake is homogeneous and symmetric bilaterally. No perfusion defects are evident. There is elevation of the right hemidiaphragm. IMPRESSION: No appreciable ventilation or perfusion defect. This study constitutes a very low probability of pulmonary embolus. There is elevation of the right hemidiaphragm. Electronically Signed   By: Bretta Bang III M.D.   On: 12/09/2017 10:05   Dg Chest Portable 1 View  Result Date: 12/08/2017 CLINICAL DATA:  Short of breath and weakness since 630 today EXAM: PORTABLE CHEST 1 VIEW COMPARISON:  09/08/2015 FINDINGS: Mild cardiomegaly. Normal vascularity. Right hemidiaphragm remains elevated. Bibasilar atelectasis. IMPRESSION: Cardiomegaly without decompensation. Bibasilar atelectasis. Electronically Signed   By: Jolaine Click M.D.   On: 12/08/2017 19:43   Dg Abd Portable 1v  Result Date: 12/12/2017 CLINICAL DATA:  Vomiting for 2 days. EXAM: PORTABLE ABDOMEN - 1 VIEW COMPARISON:  CT scan 12/09/2017 FINDINGS: Air throughout the small bowel and colon with mild distension suggesting a diffuse ileus or gastroenteritis. No findings to suggest obstruction or perforation. Colonic interposition is noted. The visualized left lung base is grossly clear. There is some residual contrast in the rectum. IMPRESSION: Large amount of air throughout the stomach, small bowel and colon suggesting an ileus or gastroenteritis. No findings for obstruction or free air. Electronically Signed   By: Rudie Meyer M.D.   On: 12/12/2017 09:49   Dg Abd Portable 1v  Result Date: 12/09/2017 CLINICAL DATA:  Nausea and vomiting EXAM: PORTABLE ABDOMEN - 1 VIEW  COMPARISON:  Abdominal ultrasound of December 09, 2017 FINDINGS: The stomach is moderately distended with air. No small or large bowel obstruction is observed. The stool burden does not appear excessive. There are no abnormal soft tissue calcifications. There are degenerative changes at the L3-4 and L4-5 disc levels. IMPRESSION: Moderate gaseous distention of the stomach may reflect gastric outlet obstruction. No evidence of a small or large bowel obstructive process. Electronically Signed   By: David  [place] M.D.   On: 12/09/2017 15:17   US Abdomen Limited Ruq  Result Date: 12/09/2017 CLINICAL DATA:  Hyperbilirubinemia EXAM: ULTRASOUND ABDOMEN LIMITED RIGHT UPPER QUADRANT COMPARISON:  None. FINDINGS: Gallbladder: Poor visualization of the  gallbladder which appears to be contracted and mildly thick-walled at 3 mm. Orientation of the gallbladder and patient body habitus made assessment problematic. Small nonshadowing filling defects in the gallbladder for example on image 5 potentially from tumefactive sludge. This was not interrogated by Doppler to assess for solid mass. Strictly speaking a gallbladder polyp could have a similar appearance for example on image 8. Sonographic Eulah Pont sign was absent.  No definite shadowing calculi. Common bile duct: Diameter: 4 mm Liver: Coarse echogenic liver with poor sonic penetration compatible with diffuse hepatic steatosis. Portal vein is patent on color Doppler imaging with normal direction of blood flow towards the liver. IMPRESSION: 1. Technically difficult exam due to orientation of the gallbladder and the patient's body habitus. Also the gallbladder appears somewhat contracted and mildly thick-walled, although sonographic Murphy sign was absent. There is some complex rounded filling defect in the gallbladder which is probably tumefactive sludge, along with several small nonshadowing intraluminal hyperechogenicies. The possibility of a polyp or small gallbladder mass is  not excluded on today's exam. Given the limited liver enzymes and constellation of findings, consider MRI/MRCP with and without contrast for further characterization. 2. Coarse echogenic liver with poor sonic penetration compatible with diffuse hepatic steatosis. Electronically Signed   By: Gaylyn Rong M.D.   On: 12/09/2017 12:50     Huey Bienenstock M.D on 12/14/2017 at 12:50 PM  Between 7am to 7pm - Pager - [phone]  After 7pm go to [email or web address] - password [facility]  [facility] -  Office  [phone]

## 2017-12-14 NOTE — Progress Notes (Signed)
Physical Therapy Treatment Patient Details Name: Tanner Gilbert MRN: [id number] DOB: 05/17/1950 Today's Date: 12/14/2017    History of Present Illness 67 year old male with A. fib, CHF, hypertension admitted with dyspnea, dehydration and sepsis of unclear source. Pt found to have acute kidney injury and hyperkalemia.     PT Comments    Pt incontinent of stool and cleaned by nursing immediately prior to therapy session. Pt unable to assist with moving LE to remove SCDs for treatment. Pt required total Ax2 for rolling to L due to inability of pt to assist in movement, all 4 extremities flaccid. During rolling pt HR increased to a max of 122 bpm. In sidelying pt found to again be incontinent of stool. Pt returned to supine and NT notified. D/c recommendations remain appropriate at this time.     Follow Up Recommendations  SNF     Equipment Recommendations  Other (comment)(TBD at next venue)    Recommendations for Other Services OT consult     Precautions / Restrictions Precautions Precautions: Fall Restrictions Weight Bearing Restrictions: No    Mobility  Bed Mobility Overal bed mobility: Needs Assistance Bed Mobility: Rolling Rolling: Total assist;+2 for physical assistance         General bed mobility comments: totalAx2 for rolling to L to attempt to bring to seated EoB. Limbs very flaccid and not engaged to assist in rolling despite verbal cuing. Pt found to be incontinent of his bowels despite just having been cleaned by nursing prior to session. Pt returned to supine.   Transfers                 General transfer comment: did not attempt due to decreased LE strength           Cognition Arousal/Alertness: Awake/alert Behavior During Therapy: WFL for tasks assessed/performed Overall Cognitive Status: History of cognitive impairments - at baseline                                 General Comments: decreased orientation to place and time, slowed  recall, able to follow commands consistently with increased time         General Comments General comments (skin integrity, edema, etc.): Pt with run of increased HR to 122 bpm with rolling, when asked pt does not feel heart racing      Pertinent Vitals/Pain Pain Assessment: No/denies pain           PT Goals (current goals can now be found in the care plan section) Acute Rehab PT Goals Patient Stated Goal: go home PT Goal Formulation: With patient/family Time For Goal Achievement: 12/25/17 Potential to Achieve Goals: Fair Progress towards PT goals: Not progressing toward goals - comment(treatment limited by incontinence of stool and decreased mob)    Frequency    Min 2X/week      PT Plan Current plan remains appropriate       AM-PAC PT "6 Clicks" Daily Activity  Outcome Measure  Difficulty turning over in bed (including adjusting bedclothes, sheets and blankets)?: Unable Difficulty moving from lying on back to sitting on the side of the bed? : Unable Difficulty sitting down on and standing up from a chair with arms (e.g., wheelchair, bedside commode, etc,.)?: Unable Help needed moving to and from a bed to chair (including a wheelchair)?: Total Help needed walking in hospital room?: Total Help needed climbing 3-5 steps with a railing? : Total 6 Click Score:  6    End of Session   Activity Tolerance: Patient limited by fatigue Patient left: in bed;with call bell/phone within reach;with bed alarm set;with family/visitor present;with SCD's reapplied Nurse Communication: Mobility status PT Visit Diagnosis: Unsteadiness on feet (R26.81);Other abnormalities of gait and mobility (R26.89);Muscle weakness (generalized) (M62.81)     Time: 1240-1300 PT Time Calculation (min) (ACUTE ONLY): 20 min  Charges:  $Therapeutic Activity: 8-22 mins                     Demaria Deeney B. Beverely RisenVan Fleet PT, DPT Acute Rehabilitation Services Pager [phone] Office [phone]  [phone]    Elon Alaslizabeth B Van Fleet 12/14/2017, 2:54 PM

## 2017-12-14 NOTE — Progress Notes (Signed)
No reported nausea/vomiting.  Slowly advance diet.  No GI intervention planned at this point.  Please call us back if needed.  Thank you.

## 2017-12-15 LAB — CULTURE, BLOOD (ROUTINE X 2)
Culture: NO GROWTH
Culture: NO GROWTH
Special Requests: ADEQUATE

## 2017-12-15 LAB — CBC
HCT: 28.7 % — ABNORMAL LOW (ref 39.0–52.0)
HEMOGLOBIN: 9.2 g/dL — AB (ref 13.0–17.0)
MCH: 30.7 pg (ref 26.0–34.0)
MCHC: 32.1 g/dL (ref 30.0–36.0)
MCV: 95.7 fL (ref 78.0–100.0)
Platelets: 144 10*3/uL — ABNORMAL LOW (ref 150–400)
RBC: 3 MIL/uL — ABNORMAL LOW (ref 4.22–5.81)
RDW: 15.9 % — ABNORMAL HIGH (ref 11.5–15.5)
WBC: 11.2 10*3/uL — ABNORMAL HIGH (ref 4.0–10.5)

## 2017-12-15 LAB — BASIC METABOLIC PANEL
Anion gap: 10 (ref 5–15)
BUN: 20 mg/dL (ref 8–23)
CHLORIDE: 113 mmol/L — AB (ref 98–111)
CO2: 24 mmol/L (ref 22–32)
CREATININE: 1.22 mg/dL (ref 0.61–1.24)
Calcium: 8.6 mg/dL — ABNORMAL LOW (ref 8.9–10.3)
GFR calc non Af Amer: 59 mL/min — ABNORMAL LOW (ref >60)
Glucose, Bld: 117 mg/dL — ABNORMAL HIGH (ref 70–99)
Potassium: 4.3 mmol/L (ref 3.5–5.1)
Sodium: 147 mmol/L — ABNORMAL HIGH (ref 135–145)

## 2017-12-15 MED ORDER — RIVAROXABAN 20 MG PO TABS
20.0000 mg | ORAL_TABLET | Freq: Every day | ORAL | Status: DC
  Administered 2017-12-15 – 2017-12-16 (×2): 20 mg via ORAL
  Filled 2017-12-15 (×2): qty 1

## 2017-12-15 MED ORDER — POTASSIUM CL IN DEXTROSE 5% 20 MEQ/L IV SOLN
20.0000 meq | INTRAVENOUS | Status: DC
  Administered 2017-12-15: 20 meq via INTRAVENOUS
  Filled 2017-12-15: qty 1000

## 2017-12-15 MED ORDER — ISOSORB DINITRATE-HYDRALAZINE 20-37.5 MG PO TABS
1.0000 | ORAL_TABLET | Freq: Three times a day (TID) | ORAL | Status: DC
  Filled 2017-12-15: qty 1

## 2017-12-15 MED ORDER — POTASSIUM CL IN DEXTROSE 5% 20 MEQ/L IV SOLN
20.0000 meq | INTRAVENOUS | Status: DC
  Administered 2017-12-15 (×2): 20 meq via INTRAVENOUS
  Filled 2017-12-15 (×3): qty 1000

## 2017-12-15 MED ORDER — CARVEDILOL 25 MG PO TABS
25.0000 mg | ORAL_TABLET | Freq: Two times a day (BID) | ORAL | Status: DC
  Filled 2017-12-15 (×2): qty 1

## 2017-12-15 MED ORDER — FUROSEMIDE 10 MG/ML IJ SOLN
20.0000 mg | Freq: Once | INTRAMUSCULAR | Status: AC
  Administered 2017-12-15: 20 mg via INTRAVENOUS
  Filled 2017-12-15: qty 2

## 2017-12-15 NOTE — Progress Notes (Signed)
PROGRESS NOTE                                                                                                                                                                                                             Patient Demographics:    Tanner Gilbert, is a 67 y.o. male, DOB - 11/24/1950, ZOX:[id number]  Admit date - 12/08/2017   Admitting Physician Eduard Clos, MD  Outpatient Primary MD for the patient is Copland, Gwenlyn Found, MD  LOS - 7   Chief Complaint  Patient presents with  . Shortness of Breath       Brief Narrative    67 y.o. male with history of atrial fibrillation, chronic systolic heart failure last EF measured in 2017 was 40 to 45%, hypertension was brought to the ER because of shortness of breath, initial suspicion was for acute on chronic systolic CHF, PE ruled out with VQ scan, as well he was noted to have acute on chronic renal failure, hypotensive, with projectile vomiting, initially on heparin GTT for anticoagulation, stopped with negative VQ scan, and hematoma in the psoas muscle, CT abdomen pelvis with no acute findings, creatinine improving with IV fluids, hospital stay was prolonged due to poor oral intake, recurrent ileus, but patient has been steadily improving over last 48 hours.   Subjective:    Tanner Gilbert today denies any complaints, good bowel movement yesterday, tolerating soft diet with no nausea or vomiting .   Assessment  & Plan :    Principal Problem:   Acute respiratory failure with hypoxia (HCC) Active Problems:   HTN (hypertension) with goal to be determined   Acute on chronic systolic heart failure (HCC)   Persistent atrial fibrillation (HCC)   ARF (acute renal failure) (HCC)   Nausea and vomiting   Hypotension   SIRS (systemic inflammatory response syndrome) (HCC)    Acute renal failure on CKD -In the setting of ATN from volume depletion and hypotension, with concurrent use of Lasix  at home, currently improved with IV fluids, creatinine back to baseline, it peaked at 4.5, it is 1.2 today. -Renal input greatly appreciated, they signed off -No evidence of obstruction on imaging. -Foley catheter has been inserted for strict ins and out on admission, continue yesterday with no evidence of retention  Psoas hematoma -Hemoglobin overall stable, dropped slightly secondary  to fluid resuscitation, the coagulation has been held, will resume Xarelto today.  Acute respiratory failure with hypoxia  -Thought to be secondary to acute on chronic systolic CHF, EF 40 to 45% in 1610, initially requiring BiPAP, significantly improved with IV diuresis active admission. -Currently on 2 L nasal cannula with no evidence of volume overload after diuresis. -VQ scan negative for PE .  Nausea and vomiting -Patient with multiple projectile vomiting on admission, x-ray initially suspicious for gastric outlet obstruction, but none could be found on CT abdomen pelvis, having good bowel movements, was kept on NGT suction, advance to clear liquid diet, which he tolerated, developed either nausea or vomiting, KUB significant for ileus . -Been tolerating soft diet for last 24 hours, but overall has poor appetite, I have encouraged his son to offer him the insurance and the pros diet, and to encourage him to increase his fluid intake due to his hyponatremia. -GI input greatly appreciated, they signed off  SIRS -Patient presents with tachycardia, hypotension, leukocytosis, as well having elevated procalcitonin, -Infectious etiology could be found so far on CT abdomen pelvis, chest x-ray with no pneumonia, negative urine analysis, but he did have elevated procalcitonin, blood cultures remain negative, he was empirically on IV Zosyn, procalcitonin trending down, blood pressure has improved, IV Zosyn stopped 12/12/2017. -Currently off stress dose hydrocortisone  Hypernatremia -Likely in the setting of decreased  fluid intake, it is 147 today despite being on half-normal saline, so I will change his fluid to D5W with potassium 75 cc/h, and recheck in a.m., I have discussed with the patient and encouraged him to increase his fluid intake.  Hypokalemia/hypomagnesemia  -Repleting as needed  Folate deficiency -B 12 within normal limits, folate significantly low at 1.9, continue with supplements on discharge  Malnutrition -Low albumin, poor oral intake, started on supplements, currently tolerating soft diet, encouraged him to drink supplements  Acute on chronic systolic CHF -Felt to be volume overloaded on admission, was on IV diuresis, last echo 2017, with EF 40 to 45%, repeat echo this admission showing improved EF at 60% . -Blood pressure has improved, increase his Coreg dose back to home dose today given he still having tachycardia, blood pressure is acceptable, resume on home dose BiDil ,not ACE or are appropriate in the setting of renal failure,   History of atrial fibrillation  -With RVR on admission, he has been on hold given hypotension, now blood pressure improved, will increase Coreg today to home dose . -Xarelto initially on hold in setting of renal failure, psoas muscle hematoma, now hemoglobin is stable, renal function back to baseline, will resume today .   Elevated LFTs -He has benign abdominal exam, some nonspecific finding on right upper quadrant ultrasound, gallbladder unremarkable on ultrasound, trending down, continue to monitor.  Anemia of chronic disease -No evidence of overt GI bleed, living drop most likely due to delutional effect, anemia panel significant for anemia of chronic disease   Code Status : Full  Family Communication  : Son at bedside  Disposition Plan  : remains in stepdown, PT recommended SNF,   Consults  :  Renal, cardiology,PCCM, GI  Procedures  : none  DVT Prophylaxis  :  SCD, subcu heparin  Lab Results  Component Value Date   PLT 144 (L)  12/15/2017    Antibiotics  :    Anti-infectives (From admission, onward)   Start     Dose/Rate Route Frequency Ordered Stop   12/09/17 1800  piperacillin-tazobactam (ZOSYN) IVPB 3.375  g  Status:  Discontinued     3.375 g 12.5 mL/hr over 240 Minutes Intravenous Every 8 hours 12/09/17 1648 12/09/17 1736   12/09/17 1737  piperacillin-tazobactam (ZOSYN) IVPB 2.25 g  Status:  Discontinued     2.25 g 100 mL/hr over 30 Minutes Intravenous Every 8 hours 12/09/17 1736 12/12/17 0719        Objective:   Vitals:   12/15/17 0400 12/15/17 0500 12/15/17 0756 12/15/17 0923  BP: 119/79  106/76 (!) 109/91  Pulse: (!) 107  (!) 110   Resp: (!) 21  17   Temp: 97.6 F (36.4 C)  98.1 F (36.7 C)   TempSrc: Axillary  Oral   SpO2: 100%  100%   Weight:  103 kg    Height:        Wt Readings from Last 3 Encounters:  12/15/17 103 kg  10/10/15 134.1 kg  10/08/15 135.4 kg     Intake/Output Summary (Last 24 hours) at 12/15/2017 1247 Last data filed at 12/15/2017 0500 Gross per 24 hour  Intake 1891.02 ml  Output 500 ml  Net 1391.02 ml     Physical Exam  Awake Alert, Oriented X 2, No new F.N deficits, flat affect Symmetrical Chest wall movement, Good air movement bilaterally, CTAB RRR,No Gallops,Rubs or new Murmurs, No Parasternal Heave +ve B.Sounds, Abd Soft, No tenderness, No rebound - guarding or rigidity. No Cyanosis, Clubbing or edema, No new Rash or bruise       Data Review:    CBC Recent Labs  Lab 12/11/17 0235 12/12/17 0613 12/13/17 0849 12/14/17 0210 12/15/17 0410  WBC 7.4 7.4 10.2 7.9 11.2*  HGB 8.7* 9.0* 9.4* 8.4* 9.2*  HCT 26.0* 27.1* 29.0* 26.6* 28.7*  PLT 187 179 213 147* 144*  MCV 93.2 93.1 96.3 97.1 95.7  MCH 31.2 30.9 31.2 30.7 30.7  MCHC 33.5 33.2 32.4 31.6 32.1  RDW 15.2 15.5 15.7* 15.9* 15.9*    Chemistries  Recent Labs  Lab 12/08/17 2257 12/09/17 0607 12/10/17 0426 12/11/17 0235 12/11/17 1517 12/12/17 0613 12/12/17 1318 12/13/17 0849  12/14/17 0210 12/15/17 0410  NA  --  136 137 141 144 145  --  148* 147* 147*  K  --  4.3 3.4* 2.9* 3.5 3.1*  --  3.1* 3.7 4.3  CL  --  102 103 111 113* 110  --  112* 114* 113*  CO2  --  15* 18* 16* 18* 22  --  27 27 24   GLUCOSE  --  135* 105* 117* 119* 132*  --  115* 114* 117*  BUN  --  43* 58* 55* 50* 40*  --  30* 25* 20  CREATININE  --  3.98* 4.53* 3.54* 2.97* 2.31*  --  1.59* 1.41* 1.22  CALCIUM  --  8.3* 8.1* 7.6* 7.7* 8.0*  --  8.4* 8.3* 8.6*  MG 1.7  --   --   --   --  1.6*  --  1.9  --   --   AST  --  111* 80* 61*  --   --  99*  --  99*  --   ALT  --  37 35 33  --   --  45*  --  51*  --   ALKPHOS  --  67 55 57  --   --  58  --  59  --   BILITOT  --  4.9* 3.6* 3.6*  --   --  3.6*  --  2.7*  --    ------------------------------------------------------------------------------------------------------------------  No results for input(s): CHOL, HDL, LDLCALC, TRIG, CHOLHDL, LDLDIRECT in the last 72 hours.  Lab Results  Component Value Date   HGBA1C 5.9 10/08/2015   ------------------------------------------------------------------------------------------------------------------ No results for input(s): TSH, T4TOTAL, T3FREE, THYROIDAB in the last 72 hours.  Invalid input(s): FREET3 ------------------------------------------------------------------------------------------------------------------ Recent Labs    12/13/17 0849  VITAMINB12 1,022*  FOLATE 1.8*    Coagulation profile No results for input(s): INR, PROTIME in the last 168 hours.  No results for input(s): DDIMER in the last 72 hours.  Cardiac Enzymes Recent Labs  Lab 12/08/17 2257 12/09/17 0607 12/09/17 1435 12/11/17 1517  CKMB  --   --   --  0.8  TROPONINI 0.05* 0.05* 0.04*  --    ------------------------------------------------------------------------------------------------------------------    Component Value Date/Time   BNP 976.0 (H) 12/08/2017 1913   BNP 336.8 (H) 08/21/2014 1624    Inpatient  Medications  Scheduled Meds: . carvedilol  12.5 mg Oral BID WC  . feeding supplement (ENSURE ENLIVE)  237 mL Oral BID BM  . feeding supplement (PRO-STAT SUGAR FREE 64)  30 mL Oral BID  . folic acid  2 mg Oral Daily  . heparin injection (subcutaneous)  5,000 Units Subcutaneous Q8H   Continuous Infusions: . dextrose 5 % with KCl 20 mEq / L 20 mEq (12/15/17 0931)   PRN Meds:.acetaminophen **OR** acetaminophen, ondansetron **OR** ondansetron (ZOFRAN) IV  Micro Results Recent Results (from the past 240 hour(s))  MRSA PCR Screening     Status: None   Collection Time: 12/08/17 10:44 PM  Result Value Ref Range Status   MRSA by PCR NEGATIVE NEGATIVE Final    Comment:        The GeneXpert MRSA Assay (FDA approved for NASAL specimens only), is one component of a comprehensive MRSA colonization surveillance program. It is not intended to diagnose MRSA infection nor to guide or monitor treatment for MRSA infections. Performed at [facility], [address] [place], [place] [address]   Culture, blood (Routine X 2) w Reflex to ID Panel     Status: None   Collection Time: 12/10/17  7:30 AM  Result Value Ref Range Status   Specimen Description BLOOD RIGHT ANTECUBITAL  Final   Special Requests   Final    BOTTLES DRAWN AEROBIC AND ANAEROBIC Blood Culture adequate volume   Culture   Final    NO GROWTH 5 DAYS Performed at [facility], [address] [place], [place] [address]    Report Status 12/15/2017 FINAL  Final  Culture, blood (Routine X 2) w Reflex to ID Panel     Status: None   Collection Time: 12/10/17  7:45 AM  Result Value Ref Range Status   Specimen Description BLOOD RIGHT FOREARM  Final   Special Requests   Final    BOTTLES DRAWN AEROBIC AND ANAEROBIC Blood Culture results may not be optimal due to an excessive volume of blood received in culture bottles   Culture   Final    NO GROWTH 5 DAYS Performed at [facility], [address] [place] [address], [place],  [place] [address]    Report Status 12/15/2017 FINAL  Final    Radiology Reports Ct Abdomen Pelvis Wo Contrast  Result Date: 12/09/2017 CLINICAL DATA:  Nausea, projectile vomiting, gastric outlet obstruction question by radiographs, excessive diarrhea, hypertension, CHF, atrial fibrillation EXAM: CT ABDOMEN AND PELVIS WITHOUT CONTRAST TECHNIQUE: Multidetector CT imaging of the abdomen and pelvis was performed following the standard protocol without IV contrast. Sagittal  and coronal MPR images reconstructed from axial data set. GI contrast administered. Scattered respiratory motion artifacts are present. COMPARISON:  None FINDINGS: Lower chest: Subsegmental atelectasis RIGHT lower lobe. LEFT lung base clear. Hepatobiliary: Marked fatty infiltration of liver. Gallbladder unremarkable. Pancreas: Normal appearance Spleen: Normal appearance Adrenals/Urinary Tract: Adrenal glands, kidneys, ureters, and bladder grossly normal appearance within the limitations imposed by motion artifacts Stomach/Bowel: Stool in rectum. Large and small bowel loops unremarkable. Nasogastric tube in stomach. Contrast and air within stomach without definite evidence of gastric mass or outlet obstruction. Vascular/Lymphatic: Atherosclerotic calcifications aorta and iliac arteries as well as coronary arteries, including LEFT main coronary artery. Aorta normal caliber. No adenopathy. Reproductive: Unremarkable Other: Umbilical hernia containing fat. No definite free air or free fluid. Musculoskeletal: Diffuse osseous demineralization. No acute bony findings. Multilevel degenerative disc disease changes lower lumbar spine. Asymmetric enlargement and increased attenuation of the LEFT psoas muscle versus RIGHT compatible with intramuscular hematoma. IMPRESSION: Intramuscular hematoma of the LEFT psoas muscle which is asymmetrically enlarged and hyperdense versus RIGHT. Fatty infiltration of liver. No definite evidence of gastric outlet obstruction or  gastric mass. Umbilical hernia containing fat. Scattered atherosclerotic calcifications of aorta, iliac arteries and coronary arteries including LEFT main coronary artery. Electronically Signed   By: Ulyses Southward M.D.   On: 12/09/2017 23:34   Nm Pulmonary Perf And Vent  Result Date: 12/09/2017 CLINICAL DATA:  Shortness of breath EXAM: NUCLEAR MEDICINE VENTILATION - PERFUSION LUNG SCAN VIEWS: Anterior, posterior, LPO, RPO, LAO, RAO-ventilation and perfusion RADIOPHARMACEUTICALS:  31.2 mCi of Tc-77m DTPA aerosol inhalation and 4.2 mCi Tc94m-MAA IV COMPARISON:  Chest radiograph December 08, 2017 FINDINGS: Ventilation: Radiotracer uptake on the ventilation study is homogeneous and symmetric bilaterally. No ventilation defects are evident. There is elevation of the right hemidiaphragm, noted on chest radiograph. Perfusion: Radiotracer uptake is homogeneous and symmetric bilaterally. No perfusion defects are evident. There is elevation of the right hemidiaphragm. IMPRESSION: No appreciable ventilation or perfusion defect. This study constitutes a very low probability of pulmonary embolus. There is elevation of the right hemidiaphragm. Electronically Signed   By: Bretta Bang III M.D.   On: 12/09/2017 10:05   Dg Chest Portable 1 View  Result Date: 12/08/2017 CLINICAL DATA:  Short of breath and weakness since 630 today EXAM: PORTABLE CHEST 1 VIEW COMPARISON:  09/08/2015 FINDINGS: Mild cardiomegaly. Normal vascularity. Right hemidiaphragm remains elevated. Bibasilar atelectasis. IMPRESSION: Cardiomegaly without decompensation. Bibasilar atelectasis. Electronically Signed   By: Jolaine Click M.D.   On: 12/08/2017 19:43   Dg Abd Portable 1v  Result Date: 12/12/2017 CLINICAL DATA:  Vomiting for 2 days. EXAM: PORTABLE ABDOMEN - 1 VIEW COMPARISON:  CT scan 12/09/2017 FINDINGS: Air throughout the small bowel and colon with mild distension suggesting a diffuse ileus or gastroenteritis. No findings to suggest  obstruction or perforation. Colonic interposition is noted. The visualized left lung base is grossly clear. There is some residual contrast in the rectum. IMPRESSION: Large amount of air throughout the stomach, small bowel and colon suggesting an ileus or gastroenteritis. No findings for obstruction or free air. Electronically Signed   By: Rudie Meyer M.D.   On: 12/12/2017 09:49   Dg Abd Portable 1v  Result Date: 12/09/2017 CLINICAL DATA:  Nausea and vomiting EXAM: PORTABLE ABDOMEN - 1 VIEW COMPARISON:  Abdominal ultrasound of December 09, 2017 FINDINGS: The stomach is moderately distended with air. No small or large bowel obstruction is observed. The stool burden does not appear excessive. There are no abnormal soft tissue calcifications.  There are degenerative changes at the L3-4 and L4-5 disc levels. IMPRESSION: Moderate gaseous distention of the stomach may reflect gastric outlet obstruction. No evidence of a small or large bowel obstructive process. Electronically Signed   By: David  SwazilandJordan M.D.   On: 12/09/2017 15:17   [place] Abdomen Limited Ruq  Result Date: 12/09/2017 CLINICAL DATA:  Hyperbilirubinemia EXAM: ULTRASOUND ABDOMEN LIMITED RIGHT UPPER QUADRANT COMPARISON:  None. FINDINGS: Gallbladder: Poor visualization of the gallbladder which appears to be contracted and mildly thick-walled at 3 mm. Orientation of the gallbladder and patient body habitus made assessment problematic. Small nonshadowing filling defects in the gallbladder for example on image 5 potentially from tumefactive sludge. This was not interrogated by Doppler to assess for solid mass. Strictly speaking a gallbladder polyp could have a similar appearance for example on image 8. Sonographic Eulah PontMurphy sign was absent.  No definite shadowing calculi. Common bile duct: Diameter: 4 mm Liver: Coarse echogenic liver with poor sonic penetration compatible with diffuse hepatic steatosis. Portal vein is patent on color Doppler imaging with normal  direction of blood flow towards the liver. IMPRESSION: 1. Technically difficult exam due to orientation of the gallbladder and the patient's body habitus. Also the gallbladder appears somewhat contracted and mildly thick-walled, although sonographic Murphy sign was absent. There is some complex rounded filling defect in the gallbladder which is probably tumefactive sludge, along with several small nonshadowing intraluminal hyperechogenicies. The possibility of a polyp or small gallbladder mass is not excluded on today's exam. Given the limited liver enzymes and constellation of findings, consider MRI/MRCP with and without contrast for further characterization. 2. Coarse echogenic liver with poor sonic penetration compatible with diffuse hepatic steatosis. Electronically Signed   By: Gaylyn RongWalter  Liebkemann M.D.   On: 12/09/2017 12:50     Huey Bienenstockawood Angeletta Goelz M.D on 12/15/2017 at 12:47 PM  Between 7am to 7pm - Pager - [phone]  After 7pm go to [email or web address] - password [facility]  [facility] -  Office  [phone]

## 2017-12-15 NOTE — Progress Notes (Signed)
CSW spoke with pt's son Billey Commanuel Eilts Jr. At pt's bedside.  Pt's son stated that he does not feel comfortable with leaving his father at a facility at night because he does not want anything to happen to his family.  CSW explained to pt's son that his father's stay would be short term under he is able to ambulate better. CSW also confirmed with  pt if he wanted to go to a skill nursing  "Pt stated no".  CSW also confirmed with pt if he wanted to go home.  Pt stated "yes".  Physician and NCM Debra updated on pt's information.  CSW will also make a referral to APS concerning pt's welfare.  Budd Palmerara Concepcion Kirkpatrick LCSWA [phone]

## 2017-12-15 NOTE — Care Management Note (Signed)
Case Management Note  Patient Details  Name: Tanner Pottermmanuel Gilbert MRN: [id number] Date of Birth: [birth year]  Subjective/Objective:   NCM informed by Delice Bisonara CSW, that son wants to take patient home because patient states he does not want to go to SNF, afraid of being there alone and that something may happen to him.  [facility], [facility], HHPT, HHOT, HHAIDE and Social Work.  Will need orders with face to face.  [facility]  Soc will begin 24- 48 hrs post dc.                  Action/Plan: DC home when ready.  Will need HH orders prior to dc.   Expected Discharge Date:  12/12/17               Expected Discharge Plan:  Home w Home Health Services  In-House Referral:  Clinical Social Work  Discharge planning Services  CM Consult  Post Acute Care Choice:  Home Health Choice offered to:  Adult Children  DME Arranged:    DME Agency:     HH Arranged:  RN, PT, OT, Nurse's Aide, Social Work [facility]  [facility]  Status of Service:  Completed, signed off  If discussed at [employer] Length of [employer] Meetings, dates discussed:    Additional Comments:  Leone Havenaylor, Ettore Trebilcock Clinton, RN 12/15/2017, 3:02 PM

## 2017-12-16 LAB — BASIC METABOLIC PANEL
ANION GAP: 8 (ref 5–15)
BUN: 18 mg/dL (ref 8–23)
CO2: 24 mmol/L (ref 22–32)
Calcium: 8 mg/dL — ABNORMAL LOW (ref 8.9–10.3)
Chloride: 108 mmol/L (ref 98–111)
Creatinine, Ser: 1.13 mg/dL (ref 0.61–1.24)
GLUCOSE: 97 mg/dL (ref 70–99)
POTASSIUM: 4.1 mmol/L (ref 3.5–5.1)
Sodium: 140 mmol/L (ref 135–145)

## 2017-12-16 LAB — CBC
HEMATOCRIT: 27 % — AB (ref 39.0–52.0)
HEMOGLOBIN: 8.8 g/dL — AB (ref 13.0–17.0)
MCH: 31.1 pg (ref 26.0–34.0)
MCHC: 32.6 g/dL (ref 30.0–36.0)
MCV: 95.4 fL (ref 78.0–100.0)
Platelets: 127 10*3/uL — ABNORMAL LOW (ref 150–400)
RBC: 2.83 MIL/uL — AB (ref 4.22–5.81)
RDW: 15.9 % — ABNORMAL HIGH (ref 11.5–15.5)
WBC: 11.6 10*3/uL — ABNORMAL HIGH (ref 4.0–10.5)

## 2017-12-16 MED ORDER — CARVEDILOL 25 MG PO TABS
25.0000 mg | ORAL_TABLET | Freq: Two times a day (BID) | ORAL | Status: DC
  Administered 2017-12-16 (×2): 25 mg via ORAL
  Filled 2017-12-16: qty 1

## 2017-12-16 NOTE — Clinical Social Work Note (Signed)
Pt will d/c home. RNCM has set up home health. Per RN pt's son will get himself a cab. PTAR is set for 5:00. RN aware. Clinical Social Worker will sign off for now as social work intervention is no longer needed. Please consult us again if new need arises.    KnollcrestBridget Naythan Douthit, [place]CSWA [phone]

## 2017-12-16 NOTE — Clinical Social Work Note (Signed)
Pt will go home with home health. Clinical Social Worker will sign off for now as social work intervention is no longer needed. Please consult us again if new need arises.   Clarisse GougeBridget A Samaiya Awadallah 12/16/2017

## 2017-12-16 NOTE — Discharge Summary (Signed)
Physician Discharge Summary  Lonnie Reth UJW:[id number] DOB: 1950-05-14 DOA: 12/08/2017  PCP: Pearline Cables, MD  Admit date: 12/08/2017 Discharge date: 12/16/2017  Time spent: 35 minutes  Recommendations for Outpatient Follow-up:   PCP in one week -Home health PT/OT/aid/RN/social work, patient and son adamantly declined rehab  Discharge Diagnoses:    SIRS   AKI   HTN (hypertension) with goal to be determined   Acute on chronic systolic heart failure (HCC)   Persistent atrial fibrillation (HCC)   Nausea and vomiting   Hypotension   SIRS (systemic inflammatory response syndrome) (HCC)   Debility  Discharge Condition: stable  Diet recommendation: heart healthy  Filed Weights   12/12/17 0539 12/15/17 0500 12/16/17 0600  Weight: 95.3 kg 103 kg 101.4 kg    History of present illness:  67 y.o.malewithhistory of atrial fibrillation, chronic systolic heart failure last EF measured in 2017 was 40 to 45%, hypertension was brought to the ER because of shortness of breath, initial suspicion was for acute on chronic systolic CHF, PE ruled out with VQ scan, as well he was noted to have acute on chronic renal failure, hypotensive, with projectile vomiting, initially on heparin GTT for anticoagulation, stopped with negative VQ scan, and hematoma in the psoas muscle, CT abdomen pelvis with no acute findings, creatinine improving with IV fluids, hospital stay was prolonged due to poor oral intake, recurrent ileus  Hospital Course:   Acute renal failure on CKD -In the setting of ATN from volume depletion and hypotension, with concurrent use of Lasix at home, currently improved with IV fluids, creatinine back to baseline, it peaked at 4.5, down to 1.2 -Renal initially consulted, they signed off -No evidence of obstruction on imaging.  Psoas hematoma-small -Hemoglobin overall stable, dropped slightly secondary to fluid resuscitation, the coagulation held initially and then with  stabilization was resumed  Acute respiratory failure with hypoxia  -secondary to acute on chronic systolic CHF, EF 40 to 45% in 5621, initially requiring BiPAP, significantly improved with IV diuresis active admission. -Currently on 2 L nasal cannula with no evidence of volume overload after diuresis. -VQ scan negative for PE .  Nausea and vomiting -Patient presented with multiple projectile vomiting on admission, x-ray initially suspicious for gastric outlet obstruction, but none could be found on CT abdomen pelvis, underwent NG decompression, then diet slowly advanced, suspected to have ileus from SIRS. -seen by GI this admit, they recommended supportive care and FU as needed -symptoms resolved, tolerating diet  SIRS -Patient presents with tachycardia, hypotension, leukocytosis, as well elevated procalcitonin, -Infectious etiology could not be found so far on CT abdomen pelvis, chest x-ray with no pneumonia, negative urine analysis. -was treated with empiric Abx initially, then stopped -now stable off abx  Hypokalemia/hypomagnesemia  -Repleted  Folate deficiency -B 12 within normal limits, folate significantly low at 1.9, continue with supplements on discharge  Malnutrition -Low albumin, poor oral intake, started on supplements, currently tolerating soft diet, encouraged supplements  History of atrial fibrillation  -With RVR on admission,  -coreg, held initially and then resumed -Xarelto initially held in setting of renal failure, psoas muscle hematoma, now hemoglobin is stable, renal function back to baseline, xarelto resumed .  Anemia of chronic disease -No evidence of overt GI bleed, living drop most likely due to dilutional effect, anemia panel significant for anemia of chronic disease   Consults  :  Renal, cardiology,PCCM, GI  Discharge Exam: Vitals:   12/16/17 1200 12/16/17 1240  BP: 102/68 (!) 91/55  Pulse:  86 (!) 107  Resp: 19   Temp:  97.9 F (36.6 C)   SpO2: 100% 90%    General: AAOx3 Cardiovascular: S1S2/RRR Respiratory: CTAB  Discharge Instructions   Discharge Instructions    Diet - low sodium heart healthy   Complete by:  As directed    Increase activity slowly   Complete by:  As directed      Allergies as of 12/16/2017      Reactions   Apixaban Other (See Comments)   Stiffness of neck and head   Lisinopril Swelling   Swelling- angioedema      Medication List    STOP taking these medications   BIDIL 20-37.5 MG tablet Generic drug:  isosorbide-hydrALAZINE   isosorbide-hydrALAZINE 20-37.5 MG tablet Commonly known as:  BIDIL     TAKE these medications   carvedilol 25 MG tablet Commonly known as:  COREG Take 1 tablet (25 mg total) by mouth 2 (two) times daily with a meal.   furosemide 40 MG tablet Commonly known as:  LASIX Take 1 tablet (40 mg total) by mouth daily.   Potassium Chloride ER 20 MEQ Tbcr Take 20 mEq by mouth daily.   rivaroxaban 20 MG Tabs tablet Commonly known as:  XARELTO TAKE 1 TABLET DAILY WITH SUPPER      Allergies  Allergen Reactions  . Apixaban Other (See Comments)    Stiffness of neck and head  . Lisinopril Swelling    Swelling- angioedema    Contact information for follow-up providers    [facility] Follow [facility].   Specialty:  Home Health Services Why:  HHRN, HHPT, HHOT, HHAIDE, Social Work SolicitorContact information: [address] [place] [id number] [phone]            Contact information for after-discharge care    Destination    [facility] .   Service:  Skilled Nursing Contact information: [address] [place] [phone]                   The results of significant diagnostics from this hospitalization (including imaging, microbiology, ancillary and laboratory) are listed below for reference.    Significant Diagnostic Studies: Ct Abdomen Pelvis Wo  Contrast  Result Date: 12/09/2017 CLINICAL DATA:  Nausea, projectile vomiting, gastric outlet obstruction question by radiographs, excessive diarrhea, hypertension, CHF, atrial fibrillation EXAM: CT ABDOMEN AND PELVIS WITHOUT CONTRAST TECHNIQUE: Multidetector CT imaging of the abdomen and pelvis was performed following the standard protocol without IV contrast. Sagittal and coronal MPR images reconstructed from axial data set. GI contrast administered. Scattered respiratory motion artifacts are present. COMPARISON:  None FINDINGS: Lower chest: Subsegmental atelectasis RIGHT lower lobe. LEFT lung base clear. Hepatobiliary: Marked fatty infiltration of liver. Gallbladder unremarkable. Pancreas: Normal appearance Spleen: Normal appearance Adrenals/Urinary Tract: Adrenal glands, kidneys, ureters, and bladder grossly normal appearance within the limitations imposed by motion artifacts Stomach/Bowel: Stool in rectum. Large and small bowel loops unremarkable. Nasogastric tube in stomach. Contrast and air within stomach without definite evidence of gastric mass or outlet obstruction. Vascular/Lymphatic: Atherosclerotic calcifications aorta and iliac arteries as well as coronary arteries, including LEFT main coronary artery. Aorta normal caliber. No adenopathy. Reproductive: Unremarkable Other: Umbilical hernia containing fat. No definite free air or free fluid. Musculoskeletal: Diffuse osseous demineralization. No acute bony findings. Multilevel degenerative disc disease changes lower lumbar spine. Asymmetric enlargement and increased attenuation of the LEFT psoas muscle versus RIGHT compatible with intramuscular hematoma. IMPRESSION: Intramuscular  hematoma of the LEFT psoas muscle which is asymmetrically enlarged and hyperdense versus RIGHT. Fatty infiltration of liver. No definite evidence of gastric outlet obstruction or gastric mass. Umbilical hernia containing fat. Scattered atherosclerotic calcifications of aorta,  iliac arteries and coronary arteries including LEFT main coronary artery. Electronically Signed   By: Ulyses Southward M.D.   On: 12/09/2017 23:34   Nm Pulmonary Perf And Vent  Result Date: 12/09/2017 CLINICAL DATA:  Shortness of breath EXAM: NUCLEAR MEDICINE VENTILATION - PERFUSION LUNG SCAN VIEWS: Anterior, posterior, LPO, RPO, LAO, RAO-ventilation and perfusion RADIOPHARMACEUTICALS:  31.2 mCi of Tc-2m DTPA aerosol inhalation and 4.2 mCi Tc69m-MAA IV COMPARISON:  Chest radiograph December 08, 2017 FINDINGS: Ventilation: Radiotracer uptake on the ventilation study is homogeneous and symmetric bilaterally. No ventilation defects are evident. There is elevation of the right hemidiaphragm, noted on chest radiograph. Perfusion: Radiotracer uptake is homogeneous and symmetric bilaterally. No perfusion defects are evident. There is elevation of the right hemidiaphragm. IMPRESSION: No appreciable ventilation or perfusion defect. This study constitutes a very low probability of pulmonary embolus. There is elevation of the right hemidiaphragm. Electronically Signed   By: Bretta Bang III M.D.   On: 12/09/2017 10:05   Dg Chest Portable 1 View  Result Date: 12/08/2017 CLINICAL DATA:  Short of breath and weakness since 630 today EXAM: PORTABLE CHEST 1 VIEW COMPARISON:  09/08/2015 FINDINGS: Mild cardiomegaly. Normal vascularity. Right hemidiaphragm remains elevated. Bibasilar atelectasis. IMPRESSION: Cardiomegaly without decompensation. Bibasilar atelectasis. Electronically Signed   By: Jolaine Click M.D.   On: 12/08/2017 19:43   Dg Abd Portable 1v  Result Date: 12/12/2017 CLINICAL DATA:  Vomiting for 2 days. EXAM: PORTABLE ABDOMEN - 1 VIEW COMPARISON:  CT scan 12/09/2017 FINDINGS: Air throughout the small bowel and colon with mild distension suggesting a diffuse ileus or gastroenteritis. No findings to suggest obstruction or perforation. Colonic interposition is noted. The visualized left lung base is grossly  clear. There is some residual contrast in the rectum. IMPRESSION: Large amount of air throughout the stomach, small bowel and colon suggesting an ileus or gastroenteritis. No findings for obstruction or free air. Electronically Signed   By: Rudie Meyer M.D.   On: 12/12/2017 09:49   Dg Abd Portable 1v  Result Date: 12/09/2017 CLINICAL DATA:  Nausea and vomiting EXAM: PORTABLE ABDOMEN - 1 VIEW COMPARISON:  Abdominal ultrasound of December 09, 2017 FINDINGS: The stomach is moderately distended with air. No small or large bowel obstruction is observed. The stool burden does not appear excessive. There are no abnormal soft tissue calcifications. There are degenerative changes at the L3-4 and L4-5 disc levels. IMPRESSION: Moderate gaseous distention of the stomach may reflect gastric outlet obstruction. No evidence of a small or large bowel obstructive process. Electronically Signed   By: David  [place] M.D.   On: 12/09/2017 15:17   US Abdomen Limited Ruq  Result Date: 12/09/2017 CLINICAL DATA:  Hyperbilirubinemia EXAM: ULTRASOUND ABDOMEN LIMITED RIGHT UPPER QUADRANT COMPARISON:  None. FINDINGS: Gallbladder: Poor visualization of the gallbladder which appears to be contracted and mildly thick-walled at 3 mm. Orientation of the gallbladder and patient body habitus made assessment problematic. Small nonshadowing filling defects in the gallbladder for example on image 5 potentially from tumefactive sludge. This was not interrogated by Doppler to assess for solid mass. Strictly speaking a gallbladder polyp could have a similar appearance for example on image 8. Sonographic Eulah Pont sign was absent.  No definite shadowing calculi. Common bile duct: Diameter: 4 mm Liver: Coarse echogenic liver with  poor sonic penetration compatible with diffuse hepatic steatosis. Portal vein is patent on color Doppler imaging with normal direction of blood flow towards the liver. IMPRESSION: 1. Technically difficult exam due to  orientation of the gallbladder and the patient's body habitus. Also the gallbladder appears somewhat contracted and mildly thick-walled, although sonographic Murphy sign was absent. There is some complex rounded filling defect in the gallbladder which is probably tumefactive sludge, along with several small nonshadowing intraluminal hyperechogenicies. The possibility of a polyp or small gallbladder mass is not excluded on today's exam. Given the limited liver enzymes and constellation of findings, consider MRI/MRCP with and without contrast for further characterization. 2. Coarse echogenic liver with poor sonic penetration compatible with diffuse hepatic steatosis. Electronically Signed   By: Gaylyn Rong M.D.   On: 12/09/2017 12:50    Microbiology: Recent Results (from the past 240 hour(s))  MRSA PCR Screening     Status: None   Collection Time: 12/08/17 10:44 PM  Result Value Ref Range Status   MRSA by PCR NEGATIVE NEGATIVE Final    Comment:        The GeneXpert MRSA Assay (FDA approved for NASAL specimens only), is one component of a comprehensive MRSA colonization surveillance program. It is not intended to diagnose MRSA infection nor to guide or monitor treatment for MRSA infections. Performed at [facility], [address] [place], [place] [address]   Culture, blood (Routine X 2) w Reflex to ID Panel     Status: None   Collection Time: 12/10/17  7:30 AM  Result Value Ref Range Status   Specimen Description BLOOD RIGHT ANTECUBITAL  Final   Special Requests   Final    BOTTLES DRAWN AEROBIC AND ANAEROBIC Blood Culture adequate volume   Culture   Final    NO GROWTH 5 DAYS Performed at [facility], [address], [place], [place] [address]    Report Status 12/15/2017 FINAL  Final  Culture, blood (Routine X 2) w Reflex to ID Panel     Status: None   Collection Time: 12/10/17  7:45 AM  Result Value Ref Range Status   Specimen Description BLOOD RIGHT FOREARM   Final   Special Requests   Final    BOTTLES DRAWN AEROBIC AND ANAEROBIC Blood Culture results may not be optimal due to an excessive volume of blood received in culture bottles   Culture   Final    NO GROWTH 5 DAYS Performed at [facility], [address], [place], [place] [address]    Report Status 12/15/2017 FINAL  Final     Labs: Basic Metabolic Panel: Recent Labs  Lab 12/12/17 0613 12/13/17 0849 12/14/17 0210 12/15/17 0410 12/16/17 0429  NA 145 148* 147* 147* 140  K 3.1* 3.1* 3.7 4.3 4.1  CL 110 112* 114* 113* 108  CO2 22 27 27 24 24   GLUCOSE 132* 115* 114* 117* 97  BUN 40* 30* 25* 20 18  CREATININE 2.31* 1.59* 1.41* 1.22 1.13  CALCIUM 8.0* 8.4* 8.3* 8.6* 8.0*  MG 1.6* 1.9  --   --   --   PHOS 3.2 1.9*  --   --   --    Liver Function Tests: Recent Labs  Lab 12/10/17 0426 12/11/17 0235 12/12/17 0613 12/12/17 1318 12/13/17 0849 12/14/17 0210  AST 80* 61*  --  99*  --  99*  ALT 35 33  --  45*  --  51*  ALKPHOS 55 57  --  58  --  59  BILITOT 3.6* 3.6*  --  3.6*  --  2.7*  PROT 6.2* 5.9*  --  5.9*  --  5.2*  ALBUMIN 2.1* 2.1* 2.2* 2.2* 2.4* 2.0*   No results for input(s): LIPASE, AMYLASE in the last 168 hours. Recent Labs  Lab 12/13/17 0849  AMMONIA 24   CBC: Recent Labs  Lab 12/12/17 0613 12/13/17 0849 12/14/17 0210 12/15/17 0410 12/16/17 0429  WBC 7.4 10.2 7.9 11.2* 11.6*  HGB 9.0* 9.4* 8.4* 9.2* 8.8*  HCT 27.1* 29.0* 26.6* 28.7* 27.0*  MCV 93.1 96.3 97.1 95.7 95.4  PLT 179 213 147* 144* 127*   Cardiac Enzymes: Recent Labs  Lab 12/11/17 1517  CKTOTAL 70  CKMB 0.8   BNP: BNP (last 3 results) Recent Labs    12/08/17 1913  BNP 976.0*    ProBNP (last 3 results) No results for input(s): PROBNP in the last 8760 hours.  CBG: No results for input(s): GLUCAP in the last 168 hours.     Signed:  Zannie Cove MD.  [facility] 12/16/2017, 3:49 PM

## 2017-12-24 ENCOUNTER — Telehealth: Payer: Self-pay | Admitting: *Deleted

## 2017-12-24 ENCOUNTER — Telehealth: Payer: Self-pay | Admitting: Family Medicine

## 2017-12-24 ENCOUNTER — Telehealth: Payer: Self-pay | Admitting: Internal Medicine

## 2017-12-24 DIAGNOSIS — E161 Other hypoglycemia: Secondary | ICD-10-CM | POA: Diagnosis not present

## 2017-12-24 DIAGNOSIS — R0689 Other abnormalities of breathing: Secondary | ICD-10-CM | POA: Diagnosis not present

## 2017-12-24 DIAGNOSIS — I499 Cardiac arrhythmia, unspecified: Secondary | ICD-10-CM | POA: Diagnosis not present

## 2017-12-24 DIAGNOSIS — E162 Hypoglycemia, unspecified: Secondary | ICD-10-CM | POA: Diagnosis not present

## 2017-12-24 NOTE — Telephone Encounter (Signed)
Copied from CRM [id number]. Topic: General - Other >> Jan 18, 2018  2:00 PM Maia Petties wrote: Reason for CRM: Pt was supposed to have HH start of care today but they have been unsuccessful in attempts to contact patient. They are hoping to complete tomorrow 12/25/17.

## 2017-12-24 NOTE — Telephone Encounter (Signed)
Noted  

## 2017-12-24 NOTE — Telephone Encounter (Signed)
Thanks Rosey Bath - have a good day

## 2017-12-24 NOTE — Telephone Encounter (Signed)
I have been notified that this pt passed away last night and am being asked to sign the death cert.  I last saw him in 07/2015, recent hospital stay but no DC summary on Epic as of yet.  I am working on finding more info here so I can potentially sign the death cert or forward to any other more current MD he may be seeing.  Called and LMOM with his son asking for any other info that he may have about cause of death and the location of the body so I can try and help

## 2017-12-24 NOTE — Telephone Encounter (Signed)
Received Physician Orders from [facility]; forwarded to provider/SLS 09/26

## 2017-12-24 NOTE — Telephone Encounter (Signed)
[facility] am that EMS had been called to the home. Your  patient passed away. Unfortunately it went directly to voice mail so I did not speak with EMS about him.  I'm so sorry,  Duncan Dull

## 2017-12-25 ENCOUNTER — Telehealth: Payer: Self-pay | Admitting: Family Medicine

## 2017-12-25 NOTE — Telephone Encounter (Signed)
Called and was able to reach his son He reports that pt was having "spasms in his lower back" which caused pt to be unable to walk or sit up.  He was also having heart problems  He was not seeing any other PCP since our last visit in 7/17 His dad's body is at [facility]  They are planning a Product/process development scientist, date is TBD Address: [address] [place], [place], [place] [address]  Phone: [phone]  Discharge summary from his hospital stay is not yet complete Advised staff at [facility] and [facility] that the inpt team may be most appropriate to complete his death cert

## 2017-12-28 ENCOUNTER — Telehealth: Payer: Self-pay | Admitting: *Deleted

## 2017-12-28 NOTE — Telephone Encounter (Signed)
Received Physician Orders from [facility]; forwarded to provider/SLS 09/30  

## 2017-12-29 DIAGNOSIS — 419620001 Death: Secondary | SNOMED CT | POA: Diagnosis not present

## 2017-12-29 DEATH — deceased

## 2019-08-31 IMAGING — NM NM PULMONARY VENT & PERF
12 series · 12 of 12 positions shown · non-contrast
Comparison: Chest radiograph December 08, 2017

CLINICAL DATA: Shortness of breath

EXAM:
NUCLEAR MEDICINE VENTILATION - PERFUSION LUNG SCAN
VIEWS:
Anterior, posterior, LPO, RPO, LAO, RAO-ventilation and perfusion
RADIOPHARMACEUTICALS:  31.2 mCi of 5c-99m DTPA aerosol inhalation
and 4.2 mCi [id number] IV

[Series 1: ant/post vent · 4.14mm/px · 1 of 1 slices shown (1 of 2)]
[im 1/1]
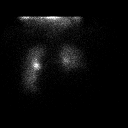

[Series 1: ant/post vent · 4.14mm/px · 1 of 1 slices shown (2 of 2)]
[im 1/1]
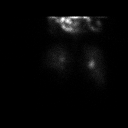

[Series 2: lao/rpo vent · 4.14mm/px · 1 of 1 slices shown (1 of 2)]
[im 1/1]
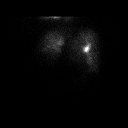

[Series 2: lao/rpo vent · 4.14mm/px · 1 of 1 slices shown (2 of 2)]
[im 1/1  full-range]
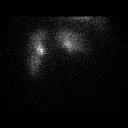

[Series 3: lpo/rao vent · 4.14mm/px · 1 of 1 slices shown (1 of 2)]
[im 1/1]
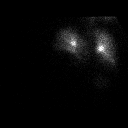

[Series 3: lpo/rao vent · 4.14mm/px · 1 of 1 slices shown (2 of 2)]
[im 1/1]
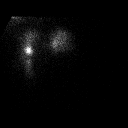

[Series 4: lpo/rao perf · 4.14mm/px · 1 of 1 slices shown (1 of 2)]
[im 1/1]
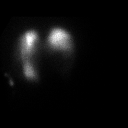

[Series 4: lpo/rao perf · 4.14mm/px · 1 of 1 slices shown (2 of 2)]
[im 1/1]
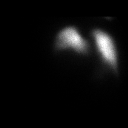

[Series 5: ant/post perf · 4.14mm/px · 1 of 1 slices shown (1 of 2)]
[im 1/1]
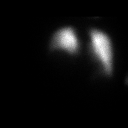

[Series 5: ant/post perf · 4.14mm/px · 1 of 1 slices shown (2 of 2)]
[im 1/1]
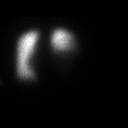

[Series 6: lao/rpo perf · 4.14mm/px · 1 of 1 slices shown (1 of 2)]
[im 1/1]
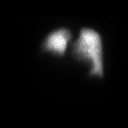

[Series 6: lao/rpo perf · 4.14mm/px · 1 of 1 slices shown (2 of 2)]
[im 1/1]
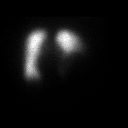

[12 of 12 positions shown; findings below may reference images not displayed]

FINDINGS: Ventilation: Radiotracer uptake on the ventilation study is
homogeneous and symmetric bilaterally. No ventilation defects are
evident. There is elevation of the right hemidiaphragm, noted on
chest radiograph.

Perfusion: Radiotracer uptake is homogeneous and symmetric
bilaterally. No perfusion defects are evident. There is elevation of
the right hemidiaphragm.
IMPRESSION: No appreciable ventilation or perfusion defect. This study
constitutes a very low probability of pulmonary embolus. There is
elevation of the right hemidiaphragm.

## 2019-09-03 IMAGING — DX DG ABD PORTABLE 1V
2 series · 2 of 2 positions shown · non-contrast
Comparison: CT scan 12/09/2017

CLINICAL DATA: Vomiting for 2 days.

EXAM:
PORTABLE ABDOMEN - 1 VIEW

[abdomen kub (1 of 2)]
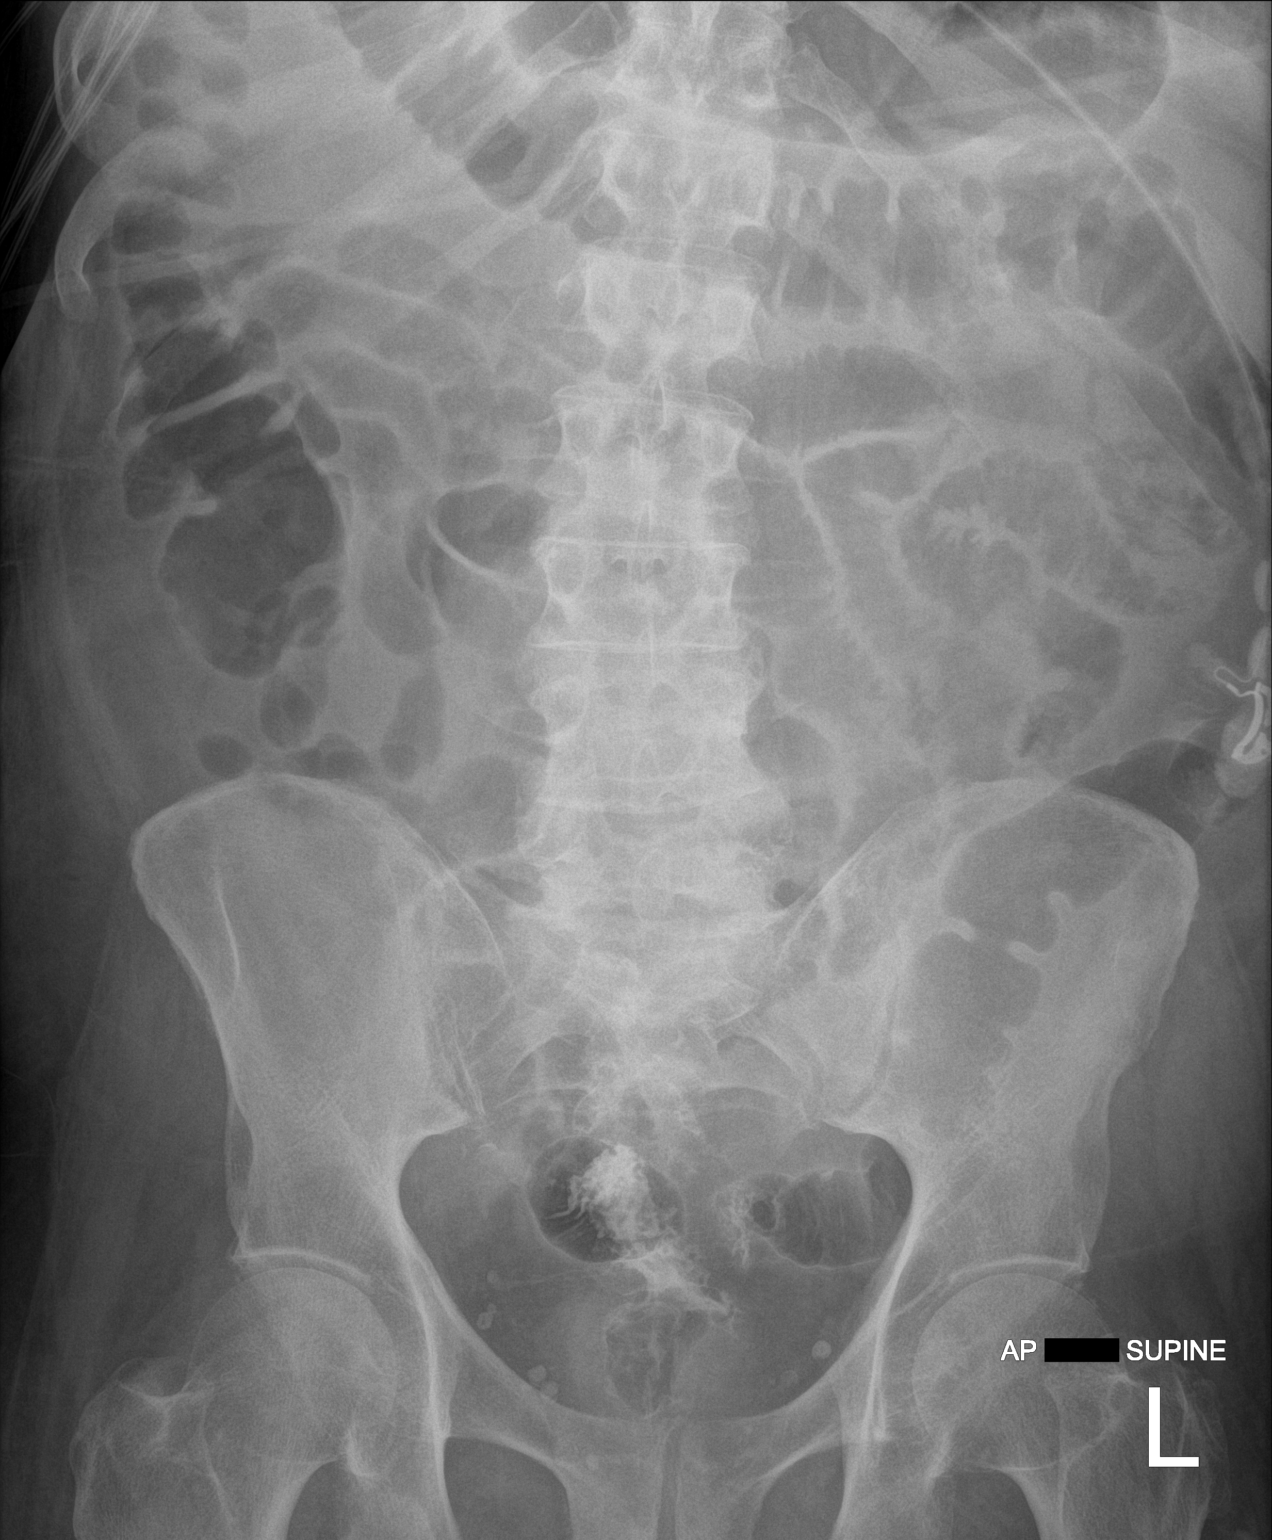

[abdomen kub (2 of 2)]
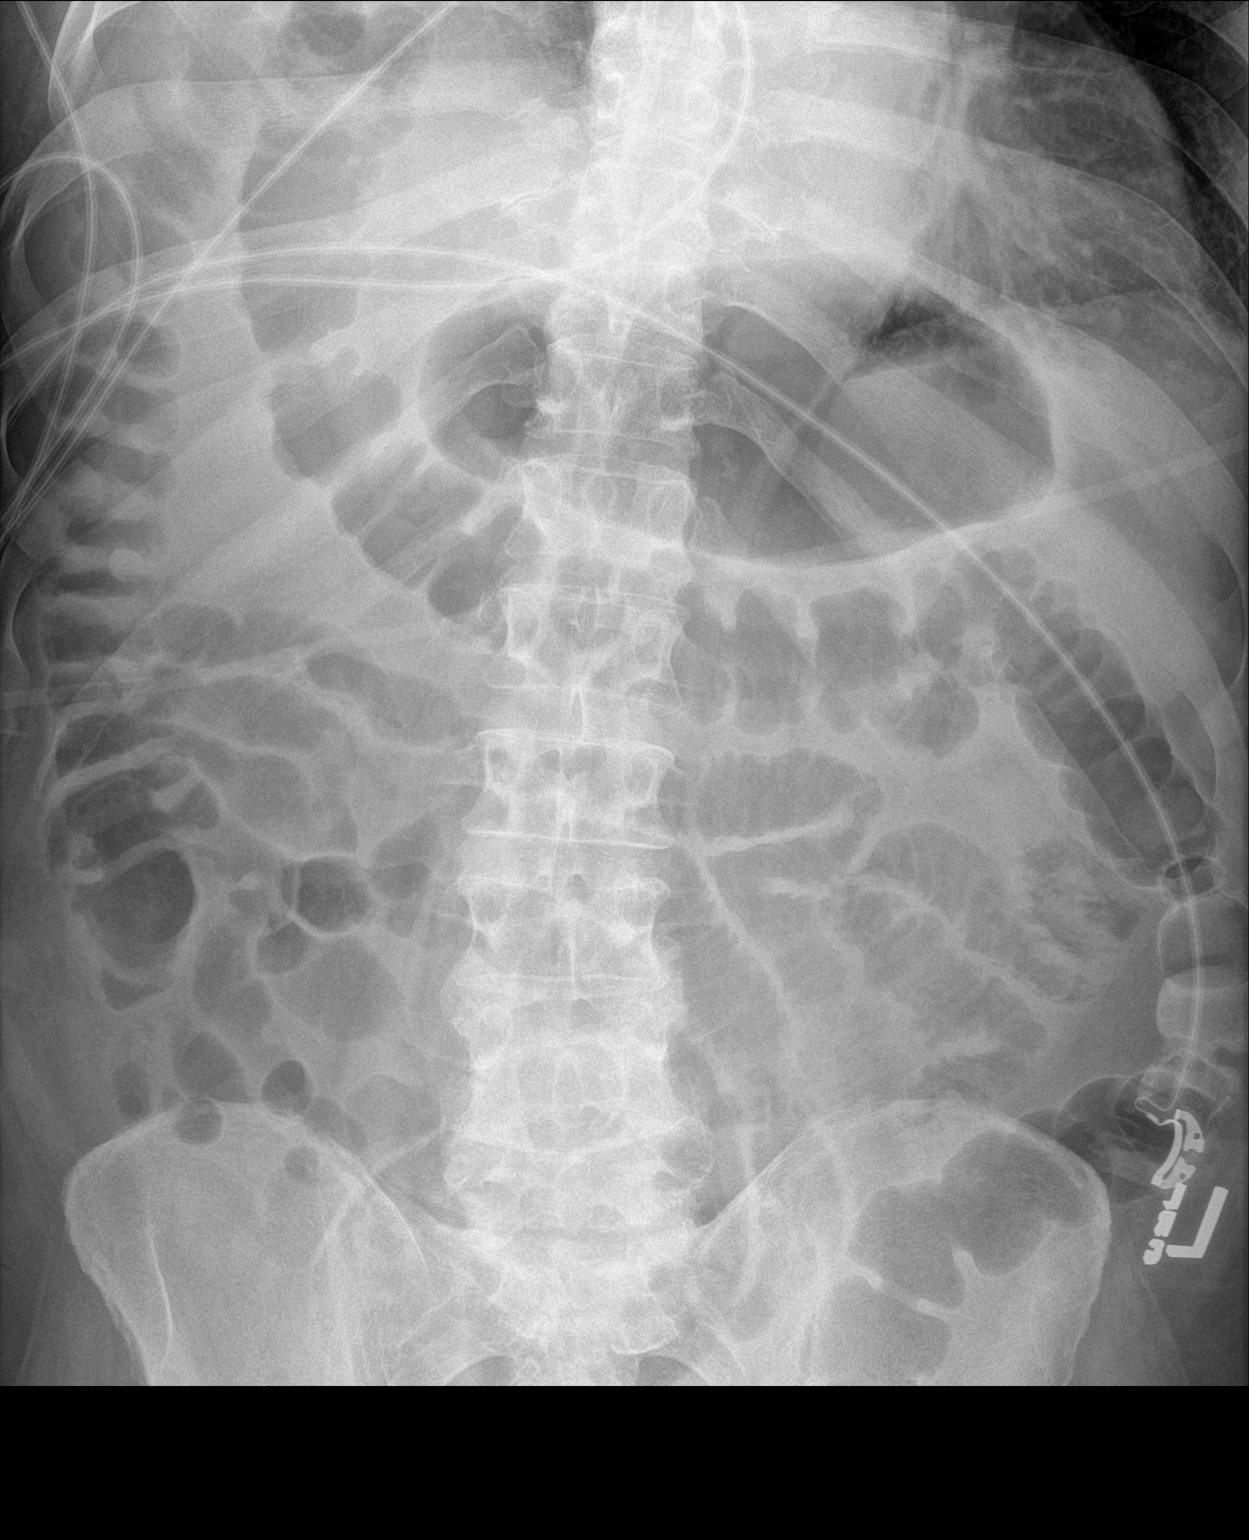

[2 of 2 positions shown; findings below may reference images not displayed]

FINDINGS: Air throughout the small bowel and colon with mild distension
suggesting a diffuse ileus or gastroenteritis. No findings to
suggest obstruction or perforation. Colonic interposition is noted.
The visualized left lung base is grossly clear. There is some
residual contrast in the rectum..
IMPRESSION: Large amount of air throughout the stomach, small bowel and colon
suggesting an ileus or gastroenteritis. No findings for obstruction
or free air.
